# Patient Record
Sex: Female | Born: 1960 | Race: White | Hispanic: No | Smoking: Never smoker
Health system: Southern US, Community
[De-identification: ages and names within clinical notes are randomized; demographics above are authoritative.]

## PROBLEM LIST (undated history)

## (undated) DIAGNOSIS — G43909 Migraine, unspecified, not intractable, without status migrainosus: Secondary | ICD-10-CM

## (undated) DIAGNOSIS — E039 Hypothyroidism, unspecified: Secondary | ICD-10-CM

## (undated) HISTORY — DX: Migraine, unspecified, not intractable, without status migrainosus: G43.909

---

## 1990-03-29 HISTORY — PX: BREAST BIOPSY: SHX20

## 1996-03-29 HISTORY — PX: ENDOMETRIAL BIOPSY: SHX622

## 2003-03-30 HISTORY — PX: OTHER SURGICAL HISTORY: SHX169

## 2004-12-16 ENCOUNTER — Ambulatory Visit: Payer: Self-pay | Admitting: Unknown Physician Specialty

## 2005-01-01 ENCOUNTER — Ambulatory Visit: Payer: Self-pay | Admitting: Unknown Physician Specialty

## 2005-01-25 ENCOUNTER — Ambulatory Visit: Payer: Self-pay | Admitting: Gastroenterology

## 2005-07-05 ENCOUNTER — Ambulatory Visit: Payer: Self-pay | Admitting: Unknown Physician Specialty

## 2006-01-24 ENCOUNTER — Ambulatory Visit: Payer: Self-pay | Admitting: Unknown Physician Specialty

## 2006-10-28 ENCOUNTER — Ambulatory Visit: Payer: Self-pay | Admitting: Unknown Physician Specialty

## 2007-02-01 ENCOUNTER — Ambulatory Visit: Payer: Self-pay | Admitting: Unknown Physician Specialty

## 2008-02-05 ENCOUNTER — Ambulatory Visit: Payer: Self-pay | Admitting: Unknown Physician Specialty

## 2009-09-16 ENCOUNTER — Ambulatory Visit: Payer: Self-pay | Admitting: Unknown Physician Specialty

## 2010-10-15 ENCOUNTER — Ambulatory Visit: Payer: Self-pay | Admitting: Unknown Physician Specialty

## 2011-10-19 ENCOUNTER — Ambulatory Visit: Payer: Self-pay | Admitting: Family Medicine

## 2011-10-21 ENCOUNTER — Ambulatory Visit: Payer: Self-pay | Admitting: Family Medicine

## 2012-11-03 ENCOUNTER — Ambulatory Visit: Payer: Self-pay | Admitting: Family Medicine

## 2013-11-09 ENCOUNTER — Ambulatory Visit: Payer: Self-pay | Admitting: Family Medicine

## 2015-04-21 ENCOUNTER — Ambulatory Visit
Admission: RE | Admit: 2015-04-21 | Discharge: 2015-04-21 | Disposition: A | Discharge status: Home / Self Care | Payer: BC Managed Care – PPO | Source: Ambulatory Visit | Attending: Gastroenterology | Admitting: Gastroenterology

## 2015-04-21 ENCOUNTER — Encounter
Admission: RE | Disposition: A | Discharge status: Home / Self Care | Payer: Self-pay | Source: Ambulatory Visit | Attending: Gastroenterology

## 2015-04-21 ENCOUNTER — Encounter: Payer: Self-pay | Admitting: *Deleted

## 2015-04-21 ENCOUNTER — Ambulatory Visit: Payer: BC Managed Care – PPO | Admitting: Anesthesiology

## 2015-04-21 DIAGNOSIS — E039 Hypothyroidism, unspecified: Secondary | ICD-10-CM | POA: Insufficient documentation

## 2015-04-21 DIAGNOSIS — Z1211 Encounter for screening for malignant neoplasm of colon: Secondary | ICD-10-CM | POA: Insufficient documentation

## 2015-04-21 HISTORY — PX: COLONOSCOPY: SHX5424

## 2015-04-21 HISTORY — DX: Hypothyroidism, unspecified: E03.9

## 2015-04-21 SURGERY — COLONOSCOPY
Anesthesia: General

## 2015-04-21 MED ORDER — LIDOCAINE HCL (PF) 1 % IJ SOLN
2.0000 mL | Freq: Once | INTRAMUSCULAR | Status: AC
  Administered 2015-04-21: 0.03 mL via INTRADERMAL

## 2015-04-21 MED ORDER — SODIUM CHLORIDE 0.9 % IV SOLN: INTRAVENOUS | Status: DC

## 2015-04-21 MED ORDER — PROPOFOL 10 MG/ML IV BOLUS
INTRAVENOUS | Status: DC | PRN
  Administered 2015-04-21: 70 mg via INTRAVENOUS
  Administered 2015-04-21 (×2): 40 mg via INTRAVENOUS
  Administered 2015-04-21: 30 mg via INTRAVENOUS

## 2015-04-21 MED ORDER — LIDOCAINE HCL (CARDIAC) 20 MG/ML IV SOLN
INTRAVENOUS | Status: DC | PRN
  Administered 2015-04-21: 20 mg via INTRAVENOUS

## 2015-04-21 MED ORDER — LIDOCAINE HCL (PF) 1 % IJ SOLN
INTRAMUSCULAR | Status: AC
  Administered 2015-04-21: 0.03 mL via INTRADERMAL
  Filled 2015-04-21: qty 2

## 2015-04-21 MED ORDER — PROPOFOL 500 MG/50ML IV EMUL
INTRAVENOUS | Status: DC | PRN
  Administered 2015-04-21: 150 ug/kg/min via INTRAVENOUS

## 2015-04-21 MED ORDER — SODIUM CHLORIDE 0.9 % IV SOLN
INTRAVENOUS | Status: DC
  Administered 2015-04-21: 1000 mL via INTRAVENOUS

## 2015-04-21 NOTE — Op Note (Signed)
[facility] Patient Name: Latasha Taylor Procedure Date: 04/21/2015 2:07 PM MRN: [id number] Account #: [id number] Date of Birth: 1961/01/27 Admit Type: Outpatient Age: 55 Room: [facility] ENDO ROOM 4 Gender: Female Note Status: Finalized Procedure:         Colonoscopy Indications:       Screening for colorectal malignant neoplasm Providers:         Ezzard Standing. Bluford Kaufmann, MD Referring MD:      Fernand Parkins. Clint Guy (Referring MD) Medicines:         Monitored Anesthesia Care Complications:     No immediate complications. Procedure:         Pre-Anesthesia Assessment:                    - Prior to the procedure, a History and Physical was                     performed, and patient medications, allergies and                     sensitivities were reviewed. The patient's tolerance of                     previous anesthesia was reviewed.                    - The risks and benefits of the procedure and the sedation                     options and risks were discussed with the patient. All                     questions were answered and informed consent was obtained.                    - After reviewing the risks and benefits, the patient was                     deemed in satisfactory condition to undergo the procedure.                    After obtaining informed consent, the colonoscope was                     passed under direct vision. Throughout the procedure, the                     patient's blood pressure, pulse, and oxygen saturations                     were monitored continuously. The Olympus CF-Q160AL                     colonoscope (S#. [id number]) was introduced through the anus                     and advanced to the the cecum, identified by appendiceal                     orifice and ileocecal valve. The colonoscopy was performed                     without difficulty. The patient tolerated the procedure  well. The quality of the bowel preparation  was good. Findings:      The colon (entire examined portion) appeared normal. Impression:        - The entire examined colon is normal.                    - No specimens collected. Recommendation:    - Discharge patient to home.                    - Repeat colonoscopy in 10 years for surveillance.                    - Return to GI office. Procedure Code(s): --- Professional ---                    [phone], Colonoscopy, flexible; diagnostic, including                     collection of specimen(s) by brushing or washing, when                     performed (separate procedure) Diagnosis Code(s): --- Professional ---                    Z12.11, Encounter for screening for malignant neoplasm of                     colon CPT copyright 2014 American Medical Association. All rights reserved. The codes documented in this report are preliminary and upon coder review may  be revised to meet current compliance requirements. Wallace Cullens, MD 04/21/2015 2:24:26 PM This report has been signed electronically. Number of Addenda: 0 Note Initiated On: 04/21/2015 2:07 PM Scope Withdrawal Time: 0 hours 6 minutes 8 seconds  Total Procedure Duration: 0 hours 9 minutes 57 seconds       [facility]

## 2015-04-21 NOTE — H&P (Signed)
    Primary Care Physician:  Domenic Schwab, FNP Primary Gastroenterologist:  Dr. Bluford Kaufmann  Pre-Procedure History & Physical: HPI:  Latasha Taylor is a 55 y.o. female is here for an colonoscopy.   No past medical history on file.  No past surgical history on file.  Prior to Admission medications   Not on File    Allergies as of 03/20/2015  . (Not on File)    No family history on file.  Social History   Social History  . Marital Status: Married    Spouse Name: N/A  . Number of Children: N/A  . Years of Education: N/A   Occupational History  . Not on file.   Social History Main Topics  . Smoking status: Not on file  . Smokeless tobacco: Not on file  . Alcohol Use: Not on file  . Drug Use: Not on file  . Sexual Activity: Not on file   Other Topics Concern  . Not on file   Social History Narrative  . No narrative on file    Review of Systems: See HPI, otherwise negative ROS  Physical Exam: There were no vitals taken for this visit. General:   Alert,  pleasant and cooperative in NAD Head:  Normocephalic and atraumatic. Neck:  Supple; no masses or thyromegaly. Lungs:  Clear throughout to auscultation.    Heart:  Regular rate and rhythm. Abdomen:  Soft, nontender and nondistended. Normal bowel sounds, without guarding, and without rebound.   Neurologic:  Alert and  oriented x4;  grossly normal neurologically.  Impression/Plan: Corrie Dandy is here for an colonoscopy to be performed for screening Risks, benefits, limitations, and alternatives regarding colonscopy have been reviewed with the patient.  Questions have been answered.  All parties agreeable.   Seth Higginbotham, Ezzard Standing, MD  04/21/2015, 1:21 PM

## 2015-04-21 NOTE — Anesthesia Preprocedure Evaluation (Signed)
Anesthesia Evaluation  Patient identified by MRN, date of birth, ID band Patient awake    Reviewed: Allergy & Precautions, H&P , NPO status , Patient's Chart, lab work & pertinent test results, reviewed documented beta blocker date and time   History of Anesthesia Complications Negative for: history of anesthetic complications  Airway Mallampati: III  TM Distance: >3 FB Neck ROM: full    Dental no notable dental hx. (+) Caps   Pulmonary neg pulmonary ROS,    Pulmonary exam normal breath sounds clear to auscultation       Cardiovascular Exercise Tolerance: Good negative cardio ROS Normal cardiovascular exam Rhythm:regular Rate:Normal     Neuro/Psych negative neurological ROS  negative psych ROS   GI/Hepatic negative GI ROS, Neg liver ROS,   Endo/Other  neg diabetesHypothyroidism   Renal/GU negative Renal ROS  negative genitourinary   Musculoskeletal   Abdominal   Peds  Hematology negative hematology ROS (+)   Anesthesia Other Findings Past Medical History:   Hypothyroidism                                               Reproductive/Obstetrics negative OB ROS                             Anesthesia Physical Anesthesia Plan  ASA: II  Anesthesia Plan: General   Post-op Pain Management:    Induction:   Airway Management Planned:   Additional Equipment:   Intra-op Plan:   Post-operative Plan:   Informed Consent: I have reviewed the patients History and Physical, chart, labs and discussed the procedure including the risks, benefits and alternatives for the proposed anesthesia with the patient or authorized representative who has indicated his/her understanding and acceptance.   Dental Advisory Given  Plan Discussed with: Anesthesiologist, CRNA and Surgeon  Anesthesia Plan Comments:         Anesthesia Quick Evaluation

## 2015-04-21 NOTE — Transfer of Care (Signed)
Immediate Anesthesia Transfer of Care Note  Patient: Latasha Taylor  Procedure(s) Performed: Procedure(s): COLONOSCOPY (N/A)  Patient Location: Endoscopy Unit  Anesthesia Type:General  Level of Consciousness: sedated  Airway & Oxygen Therapy: Patient Spontanous Breathing and Patient connected to nasal cannula oxygen  Post-op Assessment: Report given to RN and Post -op Vital signs reviewed and stable  Post vital signs: Reviewed and stable  Last Vitals:  Filed Vitals:   04/21/15 1342 04/21/15 1426  BP: 131/74 90/57  Pulse: 64 71  Temp: 35.9 C 36 C  Resp: 16 16    Complications: No apparent anesthesia complications

## 2015-04-22 NOTE — Anesthesia Postprocedure Evaluation (Signed)
Anesthesia Post Note  Patient: Latasha Taylor  Procedure(s) Performed: Procedure(s) (LRB): COLONOSCOPY (N/A)  Patient location during evaluation: Endoscopy Anesthesia Type: General Level of consciousness: awake and alert Pain management: pain level controlled Vital Signs Assessment: post-procedure vital signs reviewed and stable Respiratory status: spontaneous breathing, nonlabored ventilation, respiratory function stable and patient connected to nasal cannula oxygen Cardiovascular status: blood pressure returned to baseline and stable Postop Assessment: no signs of nausea or vomiting Anesthetic complications: no    Last Vitals:  Filed Vitals:   04/21/15 1446 04/21/15 1456  BP: 105/74 121/76  Pulse: 63 84  Temp:    Resp: 18 12    Last Pain: There were no vitals filed for this visit.               Lenard Simmer

## 2015-04-23 ENCOUNTER — Encounter: Payer: Self-pay | Admitting: Gastroenterology

## 2015-07-08 ENCOUNTER — Other Ambulatory Visit: Payer: Self-pay | Admitting: Obstetrics & Gynecology

## 2015-07-08 DIAGNOSIS — Z1239 Encounter for other screening for malignant neoplasm of breast: Secondary | ICD-10-CM

## 2015-07-16 ENCOUNTER — Ambulatory Visit
Admission: RE | Admit: 2015-07-16 | Discharge: 2015-07-16 | Disposition: A | Discharge status: Home / Self Care | Payer: BC Managed Care – PPO | Source: Ambulatory Visit | Attending: Obstetrics & Gynecology | Admitting: Obstetrics & Gynecology

## 2015-07-16 DIAGNOSIS — Z1231 Encounter for screening mammogram for malignant neoplasm of breast: Secondary | ICD-10-CM | POA: Insufficient documentation

## 2015-07-16 DIAGNOSIS — Z1239 Encounter for other screening for malignant neoplasm of breast: Secondary | ICD-10-CM

## 2016-09-27 ENCOUNTER — Ambulatory Visit (INDEPENDENT_AMBULATORY_CARE_PROVIDER_SITE_OTHER): Payer: BC Managed Care – PPO | Admitting: Advanced Practice Midwife

## 2016-09-27 ENCOUNTER — Other Ambulatory Visit: Payer: Self-pay | Admitting: Advanced Practice Midwife

## 2016-09-27 ENCOUNTER — Encounter: Payer: Self-pay | Admitting: Advanced Practice Midwife

## 2016-09-27 ENCOUNTER — Other Ambulatory Visit: Payer: Self-pay | Admitting: Unknown Physician Specialty

## 2016-09-27 VITALS — BP 114/70 | Ht 63.0 in | Wt 114.0 lb

## 2016-09-27 DIAGNOSIS — Z01419 Encounter for gynecological examination (general) (routine) without abnormal findings: Secondary | ICD-10-CM

## 2016-09-27 DIAGNOSIS — Z1231 Encounter for screening mammogram for malignant neoplasm of breast: Secondary | ICD-10-CM

## 2016-09-27 NOTE — Progress Notes (Signed)
Patient ID: Latasha Taylor, female   DOB: 08-03-1960, 56 y.o.   MRN: [id number]     Gynecology Annual Exam  PCP: Armando Gang, FNP  Chief Complaint:  Chief Complaint  Patient presents with  . Annual Exam    History of Present Illness:Patient is a 56 y.o. G2P2002 presents for annual exam. The patient has no complaints today.   LMP: No LMP recorded. Patient is postmenopausal.    The patient is sexually active. She denies dyspareunia.  The patient does perform self breast exams.  There is no notable family history of breast or ovarian cancer in her family.  The patient wears seatbelts: yes.   The patient has regular exercise: yes.  She admits healthy diet.   The patient denies current symptoms of depression.     Review of Systems: Review of Systems  Constitutional: Negative.   HENT: Negative.   Eyes: Negative.   Respiratory: Negative.   Cardiovascular: Negative.   Gastrointestinal: Negative.   Genitourinary: Negative.   Musculoskeletal: Negative.   Skin: Negative.   Neurological: Negative.   Endo/Heme/Allergies: Negative.   Psychiatric/Behavioral: Negative.     Past Medical History:  Past Medical History:  Diagnosis Date  . Hypothyroidism     Past Surgical History:  Past Surgical History:  Procedure Laterality Date  . COLONOSCOPY N/A 04/21/2015   Procedure: COLONOSCOPY;  Surgeon: Wallace Cullens, MD;  Location: [facility] ENDOSCOPY;  Service: Gastroenterology;  Laterality: N/A;    Gynecologic History:  No LMP recorded. Patient is postmenopausal. Last Pap: Results were: normal in 2015   Last mammogram: 1 year ago normal  Obstetric History: [id number]  Family History:  History reviewed. No pertinent family history.  Social History:  Social History   Social History  . Marital status: Married    Spouse name: N/A  . Number of children: N/A  . Years of education: N/A   Occupational History  . Not on file.   Social History Main Topics  . Smoking status: Never  Smoker  . Smokeless tobacco: Never Used  . Alcohol use No  . Drug use: No  . Sexual activity: Not Currently    Birth control/ protection: Post-menopausal   Other Topics Concern  . Not on file   Social History Narrative  . No narrative on file    Allergies:  No Known Allergies  Medications: Prior to Admission medications   Medication Sig Start Date End Date Taking? Authorizing Provider  levothyroxine (SYNTHROID, LEVOTHROID) 75 MCG tablet Take 75 mcg by mouth daily before breakfast.   Yes [provider]    Physical Exam Vitals: Blood pressure 114/70, height 5\' 3"  (1.6 m), weight 114 lb (51.7 kg).  General: NAD HEENT: normocephalic, anicteric Thyroid: no enlargement, no palpable nodules Pulmonary: No increased work of breathing, CTAB Cardiovascular: RRR, distal pulses 2+ Breast: Breast symmetrical, no tenderness, no palpable nodules or masses, no skin or nipple retraction present, no nipple discharge.  No axillary or supraclavicular lymphadenopathy. Abdomen: NABS, soft, non-tender, non-distended.  Umbilicus without lesions.  No hepatomegaly, splenomegaly or masses palpable. No evidence of hernia  Genitourinary: deferred for no concerns and no PAP this year  Extremities: no edema, erythema, or tenderness Neurologic: Grossly intact Psychiatric: mood appropriate, affect full   Assessment: 56 y.o. [id number] Well woman exam  Plan: Problem List Items Addressed This Visit    None    Visit Diagnoses    Well woman exam with routine gynecological exam    -  Primary  1) Mammogram - recommend yearly screening mammogram.  Mammogram patient to self schedule  2) Continue healthy lifestyle diet and exercise  3) ASCCP guidelines and rational discussed.  Patient opts for every 3-5 years screening interval  4) Osteoporosis  - per USPTF routine screening DEXA at age 56  5) Routine healthcare maintenance including cholesterol, diabetes screening discussed  Declines  6) Colonoscopy up to date.  Screening recommended starting at age 56 for average risk individuals, age 56 for individuals deemed at increased risk (including African Americans) and recommended to continue until age 56.  For patient age 56-85 individualized approach is recommended.  Gold standard screening is via colonoscopy, Cologuard screening is an acceptable alternative for patient unwilling or unable to undergo colonoscopy.  "Colorectal cancer screening for average?risk adults: 2018 guideline update from the American Cancer Society"[place]: A Cancer Journal for Clinicians: Aug 25, 2016   7) Follow up 1 year for routine annual   Latasha Taylor, [place]

## 2016-10-13 ENCOUNTER — Ambulatory Visit
Admission: RE | Admit: 2016-10-13 | Discharge: 2016-10-13 | Disposition: A | Discharge status: Home / Self Care | Payer: BC Managed Care – PPO | Source: Ambulatory Visit | Attending: Advanced Practice Midwife | Admitting: Advanced Practice Midwife

## 2016-10-13 ENCOUNTER — Telehealth: Payer: Self-pay

## 2016-10-13 DIAGNOSIS — Z1231 Encounter for screening mammogram for malignant neoplasm of breast: Secondary | ICD-10-CM | POA: Diagnosis present

## 2016-10-13 NOTE — Telephone Encounter (Signed)
Pt was seen 09/27/16 for annual exam.  JEG gave her a form to turn into her ins.  Ins has messed things up and pt needs another copy of that form to resubmit .  [phone]  Pt requests it be mailed.  Done.

## 2017-09-30 ENCOUNTER — Encounter: Payer: Self-pay | Admitting: Advanced Practice Midwife

## 2017-09-30 ENCOUNTER — Ambulatory Visit (INDEPENDENT_AMBULATORY_CARE_PROVIDER_SITE_OTHER): Payer: BC Managed Care – PPO | Admitting: Advanced Practice Midwife

## 2017-09-30 ENCOUNTER — Other Ambulatory Visit (HOSPITAL_COMMUNITY)
Admission: RE | Admit: 2017-09-30 | Discharge: 2017-09-30 | Disposition: A | Discharge status: Home / Self Care | Payer: BC Managed Care – PPO | Source: Ambulatory Visit | Attending: Advanced Practice Midwife | Admitting: Advanced Practice Midwife

## 2017-09-30 VITALS — BP 110/66 | HR 74 | Ht 63.0 in | Wt 119.0 lb

## 2017-09-30 DIAGNOSIS — Z01419 Encounter for gynecological examination (general) (routine) without abnormal findings: Secondary | ICD-10-CM

## 2017-09-30 DIAGNOSIS — Z1239 Encounter for other screening for malignant neoplasm of breast: Secondary | ICD-10-CM

## 2017-09-30 DIAGNOSIS — Z124 Encounter for screening for malignant neoplasm of cervix: Secondary | ICD-10-CM

## 2017-09-30 DIAGNOSIS — Z1231 Encounter for screening mammogram for malignant neoplasm of breast: Secondary | ICD-10-CM | POA: Diagnosis not present

## 2017-09-30 LAB — HM PAP SMEAR: HM Pap smear: NORMAL

## 2017-09-30 NOTE — Patient Instructions (Signed)
Mediterranean Diet A Mediterranean diet refers to food and lifestyle choices that are based on the traditions of countries located on the [place]. This way of eating has been shown to help prevent certain conditions and improve outcomes for people who have chronic diseases, like kidney disease and heart disease. What are tips for following this plan? Lifestyle  Cook and eat meals together with your family, when possible.  Drink enough fluid to keep your urine clear or pale yellow.  Be physically active every day. This includes: ? Aerobic exercise like running or swimming. ? Leisure activities like gardening, walking, or housework.  Get 7-8 hours of sleep each night.  If recommended by your health care provider, drink red wine in moderation. This means 1 glass a day for nonpregnant women and 2 glasses a day for men. A glass of wine equals 5 oz (150 mL). Reading food labels  Check the serving size of packaged foods. For foods such as rice and pasta, the serving size refers to the amount of cooked product, not dry.  Check the total fat in packaged foods. Avoid foods that have saturated fat or trans fats.  Check the ingredients list for added sugars, such as corn syrup. Shopping  At the grocery store, buy most of your food from the areas near the walls of the store. This includes: ? Fresh fruits and vegetables (produce). ? Grains, beans, nuts, and seeds. Some of these may be available in unpackaged forms or large amounts (in bulk). ? Fresh seafood. ? Poultry and eggs. ? Low-fat dairy products.  Buy whole ingredients instead of prepackaged foods.  Buy fresh fruits and vegetables in-season from local farmers markets.  Buy frozen fruits and vegetables in resealable bags.  If you do not have access to quality fresh seafood, buy precooked frozen shrimp or canned fish, such as tuna, salmon, or sardines.  Buy small amounts of raw or cooked vegetables, salads, or olives from the  deli or salad bar at your store.  Stock your pantry so you always have certain foods on hand, such as olive oil, canned tuna, canned tomatoes, rice, pasta, and beans. Cooking  Cook foods with extra-virgin olive oil instead of using butter or other vegetable oils.  Have meat as a side dish, and have vegetables or grains as your main dish. This means having meat in small portions or adding small amounts of meat to foods like pasta or stew.  Use beans or vegetables instead of meat in common dishes like chili or lasagna.  Experiment with different cooking methods. Try roasting or broiling vegetables instead of steaming or sauteing them.  Add frozen vegetables to soups, stews, pasta, or rice.  Add nuts or seeds for added healthy fat at each meal. You can add these to yogurt, salads, or vegetable dishes.  Marinate fish or vegetables using olive oil, lemon juice, garlic, and fresh herbs. Meal planning  Plan to eat 1 vegetarian meal one day each week. Try to work up to 2 vegetarian meals, if possible.  Eat seafood 2 or more times a week.  Have healthy snacks readily available, such as: ? Vegetable sticks with hummus. ? Greek yogurt. ? Fruit and nut trail mix.  Eat balanced meals throughout the week. This includes: ? Fruit: 2-3 servings a day ? Vegetables: 4-5 servings a day ? Low-fat dairy: 2 servings a day ? Fish, poultry, or lean meat: 1 serving a day ? Beans and legumes: 2 or more servings a week ? Nuts   and seeds: 1-2 servings a day ? Whole grains: 6-8 servings a day ? Extra-virgin olive oil: 3-4 servings a day  Limit red meat and sweets to only a few servings a month What are my food choices?  Mediterranean diet ? Recommended ? Grains: Whole-grain pasta. Brown rice. Bulgar wheat. Polenta. Couscous. Whole-wheat bread. Oatmeal. Quinoa. ? Vegetables: Artichokes. Beets. Broccoli. Cabbage. Carrots. Eggplant. Green beans. Chard. Kale. Spinach. Onions. Leeks. Peas. Squash.  Tomatoes. Peppers. Radishes. ? Fruits: Apples. Apricots. Avocado. Berries. Bananas. Cherries. Dates. Figs. Grapes. Lemons. Melon. Oranges. Peaches. Plums. Pomegranate. ? Meats and other protein foods: Beans. Almonds. Sunflower seeds. Pine nuts. Peanuts. Cod. Salmon. Scallops. Shrimp. Tuna. Tilapia. Clams. Oysters. Eggs. ? Dairy: Low-fat milk. Cheese. Greek yogurt. ? Beverages: Water. Red wine. Herbal tea. ? Fats and oils: Extra virgin olive oil. Avocado oil. Grape seed oil. ? Sweets and desserts: Greek yogurt with honey. Baked apples. Poached pears. Trail mix. ? Seasoning and other foods: Basil. Cilantro. Coriander. Cumin. Mint. Parsley. Sage. Rosemary. Tarragon. Garlic. Oregano. Thyme. Pepper. Balsalmic vinegar. Tahini. Hummus. Tomato sauce. Olives. Mushrooms. ? Limit these ? Grains: Prepackaged pasta or rice dishes. Prepackaged cereal with added sugar. ? Vegetables: Deep fried potatoes (french fries). ? Fruits: Fruit canned in syrup. ? Meats and other protein foods: Beef. Pork. Lamb. Poultry with skin. Hot dogs. Bacon. ? Dairy: Ice cream. Sour cream. Whole milk. ? Beverages: Juice. Sugar-sweetened soft drinks. Beer. Liquor and spirits. ? Fats and oils: Butter. Canola oil. Vegetable oil. Beef fat (tallow). Lard. ? Sweets and desserts: Cookies. Cakes. Pies. Candy. ? Seasoning and other foods: Mayonnaise. Premade sauces and marinades. ? The items listed may not be a complete list. Talk with your dietitian about what dietary choices are right for you. Summary  The Mediterranean diet includes both food and lifestyle choices.  Eat a variety of fresh fruits and vegetables, beans, nuts, seeds, and whole grains.  Limit the amount of red meat and sweets that you eat.  Talk with your health care provider about whether it is safe for you to drink red wine in moderation. This means 1 glass a day for nonpregnant women and 2 glasses a day for men. A glass of wine equals 5 oz (150 mL). This information  is not intended to replace advice given to you by your health care provider. Make sure you discuss any questions you have with your health care provider. Document Released: 11/06/2015 Document Revised: 12/09/2015 Document Reviewed: 11/06/2015 Elsevier Interactive Patient Education  2018 [employer] American Heart Association (AHA) Exercise Recommendation  Being physically active is important to prevent heart disease and stroke, the nation's No. 1and No. 5killers. To improve overall cardiovascular health, we suggest at least 150 minutes per week of moderate exercise or 75 minutes per week of vigorous exercise (or a combination of moderate and vigorous activity). Thirty minutes a day, five times a week is an easy goal to remember. You will also experience benefits even if you divide your time into two or three segments of 10 to 15 minutes per day.  For people who would benefit from lowering their blood pressure or cholesterol, we recommend 40 minutes of aerobic exercise of moderate to vigorous intensity three to four times a week to lower the risk for heart attack and stroke.  Physical activity is anything that makes you move your body and burn calories.  This includes things like climbing stairs or playing sports. Aerobic exercises benefit your heart, and include walking, jogging, swimming or biking. Strength and   stretching exercises are best for overall stamina and flexibility.  The simplest, positive change you can make to effectively improve your heart health is to start walking. It's enjoyable, free, easy, social and great exercise. A walking program is flexible and boasts high success rates because people can stick with it. It's easy for walking to become a regular and satisfying part of life.   For Overall Cardiovascular Health:  At least 30 minutes of moderate-intensity aerobic activity at least 5 days per week for a total of 150  OR   At least 25 minutes of vigorous aerobic activity  at least 3 days per week for a total of 75 minutes; or a combination of moderate- and vigorous-intensity aerobic activity  AND   Moderate- to high-intensity muscle-strengthening activity at least 2 days per week for additional health benefits.  For Lowering Blood Pressure and Cholesterol  An average 40 minutes of moderate- to vigorous-intensity aerobic activity 3 or 4 times per week  What if I can't make it to the time goal? Something is always better than nothing! And everyone has to start somewhere. Even if you've been sedentary for years, today is the day you can begin to make healthy changes in your life. If you don't think you'll make it for 30 or 40 minutes, set a reachable goal for today. You can work up toward your overall goal by increasing your time as you get stronger. Don't let all-or-nothing thinking rob you of doing what you can every day.  Source:http://www.heart.org   Health Maintenance, Female Adopting a healthy lifestyle and getting preventive care can go a long way to promote health and wellness. Talk with your health care provider about what schedule of regular examinations is right for you. This is a good chance for you to check in with your provider about disease prevention and staying healthy. In between checkups, there are plenty of things you can do on your own. Experts have done a lot of research about which lifestyle changes and preventive measures are most likely to keep you healthy. Ask your health care provider for more information. Weight and diet Eat a healthy diet  Be sure to include plenty of vegetables, fruits, low-fat dairy products, and lean protein.  Do not eat a lot of foods high in solid fats, added sugars, or salt.  Get regular exercise. This is one of the most important things you can do for your health. ? Most adults should exercise for at least 150 minutes each week. The exercise should increase your heart rate and make you sweat  (moderate-intensity exercise). ? Most adults should also do strengthening exercises at least twice a week. This is in addition to the moderate-intensity exercise.  Maintain a healthy weight  Body mass index (BMI) is a measurement that can be used to identify possible weight problems. It estimates body fat based on height and weight. Your health care provider can help determine your BMI and help you achieve or maintain a healthy weight.  For females 20 years of age and older: ? A BMI below 18.5 is considered underweight. ? A BMI of 18.5 to 24.9 is normal. ? A BMI of 25 to 29.9 is considered overweight. ? A BMI of 30 and above is considered obese.  Watch levels of cholesterol and blood lipids  You should start having your blood tested for lipids and cholesterol at 57 years of age, then have this test every 5 years.  You may need to have your cholesterol levels   checked more often if: ? Your lipid or cholesterol levels are high. ? You are older than 57 years of age. ? You are at high risk for heart disease.  Cancer screening Lung Cancer  Lung cancer screening is recommended for adults 64-5 years old who are at high risk for lung cancer because of a history of smoking.  A yearly low-dose CT scan of the lungs is recommended for people who: ? Currently smoke. ? Have quit within the past 15 years. ? Have at least a 30-pack-year history of smoking. A pack year is smoking an average of one pack of cigarettes a day for 1 year.  Yearly screening should continue until it has been 15 years since you quit.  Yearly screening should stop if you develop a health problem that would prevent you from having lung cancer treatment.  Breast Cancer  Practice breast self-awareness. This means understanding how your breasts normally appear and feel.  It also means doing regular breast self-exams. Let your health care provider know about any changes, no matter how small.  If you are in your 20s or  30s, you should have a clinical breast exam (CBE) by a health care provider every 1-3 years as part of a regular health exam.  If you are 56 or older, have a CBE every year. Also consider having a breast X-ray (mammogram) every year.  If you have a family history of breast cancer, talk to your health care provider about genetic screening.  If you are at high risk for breast cancer, talk to your health care provider about having an MRI and a mammogram every year.  Breast cancer gene (BRCA) assessment is recommended for women who have family members with BRCA-related cancers. BRCA-related cancers include: ? Breast. ? Ovarian. ? Tubal. ? Peritoneal cancers.  Results of the assessment will determine the need for genetic counseling and BRCA1 and BRCA2 testing.  Cervical Cancer Your health care provider may recommend that you be screened regularly for cancer of the pelvic organs (ovaries, uterus, and vagina). This screening involves a pelvic examination, including checking for microscopic changes to the surface of your cervix (Pap test). You may be encouraged to have this screening done every 3 years, beginning at age 29.  For women ages 3-65, health care providers may recommend pelvic exams and Pap testing every 3 years, or they may recommend the Pap and pelvic exam, combined with testing for human papilloma virus (HPV), every 5 years. Some types of HPV increase your risk of cervical cancer. Testing for HPV may also be done on women of any age with unclear Pap test results.  Other health care providers may not recommend any screening for nonpregnant women who are considered low risk for pelvic cancer and who do not have symptoms. Ask your health care provider if a screening pelvic exam is right for you.  If you have had past treatment for cervical cancer or a condition that could lead to cancer, you need Pap tests and screening for cancer for at least 20 years after your treatment. If Pap tests  have been discontinued, your risk factors (such as having a new sexual partner) need to be reassessed to determine if screening should resume. Some women have medical problems that increase the chance of getting cervical cancer. In these cases, your health care provider may recommend more frequent screening and Pap tests.  Colorectal Cancer  This type of cancer can be detected and often prevented.  Routine colorectal cancer screening  usually begins at 57 years of age and continues through 57 years of age.  Your health care provider may recommend screening at an earlier age if you have risk factors for colon cancer.  Your health care provider may also recommend using home test kits to check for hidden blood in the stool.  A small camera at the end of a tube can be used to examine your colon directly (sigmoidoscopy or colonoscopy). This is done to check for the earliest forms of colorectal cancer.  Routine screening usually begins at age 8.  Direct examination of the colon should be repeated every 5-10 years through 57 years of age. However, you may need to be screened more often if early forms of precancerous polyps or small growths are found.  Skin Cancer  Check your skin from head to toe regularly.  Tell your health care provider about any new moles or changes in moles, especially if there is a change in a mole's shape or color.  Also tell your health care provider if you have a mole that is larger than the size of a pencil eraser.  Always use sunscreen. Apply sunscreen liberally and repeatedly throughout the day.  Protect yourself by wearing long sleeves, pants, a wide-brimmed hat, and sunglasses whenever you are outside.  Heart disease, diabetes, and high blood pressure  High blood pressure causes heart disease and increases the risk of stroke. High blood pressure is more likely to develop in: ? People who have blood pressure in the high end of the normal range (130-139/85-89 mm  Hg). ? People who are overweight or obese. ? People who are African American.  If you are 29-23 years of age, have your blood pressure checked every 3-5 years. If you are 68 years of age or older, have your blood pressure checked every year. You should have your blood pressure measured twice-once when you are at a hospital or clinic, and once when you are not at a hospital or clinic. Record the average of the two measurements. To check your blood pressure when you are not at a hospital or clinic, you can use: ? An automated blood pressure machine at a pharmacy. ? A home blood pressure monitor.  If you are between 82 years and 71 years old, ask your health care provider if you should take aspirin to prevent strokes.  Have regular diabetes screenings. This involves taking a blood sample to check your fasting blood sugar level. ? If you are at a normal weight and have a low risk for diabetes, have this test once every three years after 57 years of age. ? If you are overweight and have a high risk for diabetes, consider being tested at a younger age or more often. Preventing infection Hepatitis B  If you have a higher risk for hepatitis B, you should be screened for this virus. You are considered at high risk for hepatitis B if: ? You were born in a country where hepatitis B is common. Ask your health care provider which countries are considered high risk. ? Your parents were born in a high-risk country, and you have not been immunized against hepatitis B (hepatitis B vaccine). ? You have HIV or AIDS. ? You use needles to inject street drugs. ? You live with someone who has hepatitis B. ? You have had sex with someone who has hepatitis B. ? You get hemodialysis treatment. ? You take certain medicines for conditions, including cancer, organ transplantation, and autoimmune conditions.  Hepatitis C  Blood testing is recommended for: ? Everyone born from [birth year] through 1965. ? Anyone with known  risk factors for hepatitis C.  Sexually transmitted infections (STIs)  You should be screened for sexually transmitted infections (STIs) including gonorrhea and chlamydia if: ? You are sexually active and are younger than 57 years of age. ? You are older than 57 years of age and your health care provider tells you that you are at risk for this type of infection. ? Your sexual activity has changed since you were last screened and you are at an increased risk for chlamydia or gonorrhea. Ask your health care provider if you are at risk.  If you do not have HIV, but are at risk, it may be recommended that you take a prescription medicine daily to prevent HIV infection. This is called pre-exposure prophylaxis (PrEP). You are considered at risk if: ? You are sexually active and do not regularly use condoms or know the HIV status of your partner(s). ? You take drugs by injection. ? You are sexually active with a partner who has HIV.  Talk with your health care provider about whether you are at high risk of being infected with HIV. If you choose to begin PrEP, you should first be tested for HIV. You should then be tested every 3 months for as long as you are taking PrEP. Pregnancy  If you are premenopausal and you may become pregnant, ask your health care provider about preconception counseling.  If you may become pregnant, take 400 to 800 micrograms (mcg) of folic acid every day.  If you want to prevent pregnancy, talk to your health care provider about birth control (contraception). Osteoporosis and menopause  Osteoporosis is a disease in which the bones lose minerals and strength with aging. This can result in serious bone fractures. Your risk for osteoporosis can be identified using a bone density scan.  If you are 74 years of age or older, or if you are at risk for osteoporosis and fractures, ask your health care provider if you should be screened.  Ask your health care provider whether you  should take a calcium or vitamin D supplement to lower your risk for osteoporosis.  Menopause may have certain physical symptoms and risks.  Hormone replacement therapy may reduce some of these symptoms and risks. Talk to your health care provider about whether hormone replacement therapy is right for you. Follow these instructions at home:  Schedule regular health, dental, and eye exams.  Stay current with your immunizations.  Do not use any tobacco products including cigarettes, chewing tobacco, or electronic cigarettes.  If you are pregnant, do not drink alcohol.  If you are breastfeeding, limit how much and how often you drink alcohol.  Limit alcohol intake to no more than 1 drink per day for nonpregnant women. One drink equals 12 ounces of beer, 5 ounces of wine, or 1 ounces of hard liquor.  Do not use street drugs.  Do not share needles.  Ask your health care provider for help if you need support or information about quitting drugs.  Tell your health care provider if you often feel depressed.  Tell your health care provider if you have ever been abused or do not feel safe at home. This information is not intended to replace advice given to you by your health care provider. Make sure you discuss any questions you have with your health care provider. Document Released: 09/28/2010 Document Revised: 08/21/2015 Document Reviewed:  12/17/2014 Elsevier Interactive Patient Education  [employer].

## 2017-09-30 NOTE — Progress Notes (Signed)
Patient ID: Latasha Taylor, female   DOB: [birth year], 57 y.o.   MRN: [id number]      Gynecology Annual Exam  PCP: Armando GangLindley, Cheryl P, FNP  Chief Complaint:  Chief Complaint  Patient presents with  . Gynecologic Exam    History of Present Illness:Patient is a 57 y.o. G2P2002 presents for annual exam. The patient has no complaints today.   LMP: No LMP recorded. Patient is postmenopausal. Menarche:not applicable   Postcoital Bleeding: no Dysmenorrhea: not applicable  The patient is not currently sexually active. She denies dyspareunia.  The patient does perform self breast exams.  There is no notable family history of breast or ovarian cancer in her family.  The patient wears seatbelts: yes.   The patient has regular exercise: yes.    The patient denies current symptoms of depression.     Review of Systems: Review of Systems  Constitutional: Negative.   HENT: Negative.   Eyes: Negative.   Respiratory: Negative.   Cardiovascular: Negative.   Gastrointestinal: Negative.   Genitourinary: Negative.   Musculoskeletal: Negative.   Skin: Negative.   Neurological: Negative.   Endo/Heme/Allergies: Negative.   Psychiatric/Behavioral: Negative.     Past Medical History:  Past Medical History:  Diagnosis Date  . Hypothyroidism     Past Surgical History:  Past Surgical History:  Procedure Laterality Date  . BREAST BIOPSY  1992   Left  . COLONOSCOPY N/A 04/21/2015   Procedure: COLONOSCOPY;  Surgeon: Wallace CullensPaul Y Oh, MD;  [facility];  Service: Gastroenterology;  Laterality: N/A;  . endocervical polyp removal  2005  . ENDOMETRIAL BIOPSY  1998    Gynecologic History:  No LMP recorded. Patient is postmenopausal. Last Pap: 4 years ago Results were:  no abnormalities  Last mammogram: 1 year ago Results were: BI-RAD I  Obstetric History: [id number]: G2P2002  Family History:  Family History  Problem Relation Age of Onset  . Celiac disease Mother   . Heart disease Father   .  Colon cancer Maternal Grandfather [id number]  . Breast cancer Neg Hx     Social History:  Social History   Socioeconomic History  . Marital status: Married    Spouse name: Not on file  . Number of children: Not on file  . Years of education: Not on file  . Highest education level: Not on file  Occupational History  . Not on file  Social Needs  . Financial resource strain: Not on file  . Food insecurity:    Worry: Not on file    Inability: Not on file  . Transportation needs:    Medical: Not on file    Non-medical: Not on file  Tobacco Use  . Smoking status: Never Smoker  . Smokeless tobacco: Never Used  Substance and Sexual Activity  . Alcohol use: No  . Drug use: No  . Sexual activity: Not Currently    Birth control/protection: Post-menopausal  Lifestyle  . Physical activity:    Days per week: 3 days    Minutes per session: 30 min  . Stress: Not on file  Relationships  . Social connections:    Talks on phone: Not on file    Gets together: Not on file    Attends religious service: Not on file    Active member of club or organization: Not on file    Attends meetings of clubs or organizations: Not on file    Relationship status: Not on file  . Intimate partner violence:  Fear of current or ex partner: Not on file    Emotionally abused: Not on file    Physically abused: Not on file    Forced sexual activity: Not on file  Other Topics Concern  . Not on file  Social History Narrative  . Not on file    Allergies:  No Known Allergies  Medications: Prior to Admission medications   Medication Sig Start Date End Date Taking? Authorizing Provider  levothyroxine (SYNTHROID, LEVOTHROID) 75 MCG tablet Take 75 mcg by mouth daily before breakfast.   Yes [provider]    Physical Exam Vitals: Blood pressure 110/66, pulse 74, height 5\' 3"  (1.6 m), weight 119 lb (54 kg).  General: NAD HEENT: normocephalic, anicteric Thyroid: no enlargement, no palpable  nodules Pulmonary: No increased work of breathing, CTAB Cardiovascular: RRR, distal pulses 2+ Breast: Breast symmetrical, no tenderness, no palpable nodules or masses, no skin or nipple retraction present, no nipple discharge.  No axillary or supraclavicular lymphadenopathy. Abdomen: NABS, soft, non-tender, non-distended.  Umbilicus without lesions.  No hepatomegaly, splenomegaly or masses palpable. No evidence of hernia  Genitourinary:  External: Normal external female genitalia.  Normal urethral meatus, normal Bartholin's and Skene's glands.    Vagina: Normal vaginal mucosa, no evidence of prolapse.    Cervix: Grossly normal in appearance, no bleeding, no CMT  Uterus: Non-enlarged, mobile, normal contour.    Adnexa: ovaries non-enlarged, no adnexal masses  Rectal: deferred  Lymphatic: no evidence of inguinal lymphadenopathy Extremities: no edema, erythema, or tenderness Neurologic: Grossly intact Psychiatric: mood appropriate, affect full     Assessment: 57 y.o. G2P2002 routine annual exam  Plan: Problem List Items Addressed This Visit    None    Visit Diagnoses    Well woman exam with routine gynecological exam    -  Primary   Relevant Orders   Cytology - PAP   MM DIGITAL SCREENING BILATERAL   Cervical cancer screening       Relevant Orders   Cytology - PAP   Breast cancer screening       Relevant Orders   MM DIGITAL SCREENING BILATERAL      1) Mammogram - recommend yearly screening mammogram.  Mammogram Was ordered today  2) STI screening  wasoffered and declined  3) ASCCP guidelines and rational discussed.  Patient opts for every 3-5 years screening interval  4) Osteoporosis  - per USPTF routine screening DEXA at age 79 Patient had a normal screen a few years ago   5) Routine healthcare maintenance including cholesterol, diabetes screening discussed managed by PCP  6) Colonoscopy Up to date.  Screening recommended starting at age 10 for average risk  individuals, age 54 for individuals deemed at increased risk (including African Americans) and recommended to continue until age 66.  For patient age 71-85 individualized approach is recommended.  Gold standard screening is via colonoscopy, Cologuard screening is an acceptable alternative for patient unwilling or unable to undergo colonoscopy.  "Colorectal cancer screening for average?risk adults: 2018 guideline update from the American Cancer Society"CA: A Cancer Journal for Clinicians: Aug 25, 2016   7) Return in 1 year (on 10/01/2018) for annual established gyn.   Tresea Mall, CNM   09/30/2017, 9:10 AM

## 2017-10-04 LAB — CYTOLOGY - PAP
Diagnosis: NEGATIVE
HPV: NOT DETECTED

## 2017-10-26 ENCOUNTER — Ambulatory Visit
Admission: RE | Admit: 2017-10-26 | Discharge: 2017-10-26 | Disposition: A | Discharge status: Home / Self Care | Payer: BC Managed Care – PPO | Source: Ambulatory Visit | Attending: Advanced Practice Midwife | Admitting: Advanced Practice Midwife

## 2017-10-26 DIAGNOSIS — Z1239 Encounter for other screening for malignant neoplasm of breast: Secondary | ICD-10-CM

## 2017-10-26 DIAGNOSIS — Z1231 Encounter for screening mammogram for malignant neoplasm of breast: Secondary | ICD-10-CM | POA: Insufficient documentation

## 2017-10-26 DIAGNOSIS — Z01419 Encounter for gynecological examination (general) (routine) without abnormal findings: Secondary | ICD-10-CM | POA: Insufficient documentation

## 2018-10-12 ENCOUNTER — Ambulatory Visit (INDEPENDENT_AMBULATORY_CARE_PROVIDER_SITE_OTHER): Payer: BC Managed Care – PPO | Admitting: Advanced Practice Midwife

## 2018-10-12 ENCOUNTER — Encounter: Payer: Self-pay | Admitting: Advanced Practice Midwife

## 2018-10-12 ENCOUNTER — Other Ambulatory Visit: Payer: Self-pay

## 2018-10-12 VITALS — BP 124/78 | Ht 64.0 in | Wt 118.0 lb

## 2018-10-12 DIAGNOSIS — Z1239 Encounter for other screening for malignant neoplasm of breast: Secondary | ICD-10-CM

## 2018-10-12 DIAGNOSIS — Z01419 Encounter for gynecological examination (general) (routine) without abnormal findings: Secondary | ICD-10-CM

## 2018-10-12 DIAGNOSIS — Z0000 Encounter for general adult medical examination without abnormal findings: Secondary | ICD-10-CM

## 2018-10-12 NOTE — Progress Notes (Signed)
Gynecology Annual Exam  PCP: Armando GangLindley, Cheryl P, FNP  Chief Complaint:  Chief Complaint  Patient presents with  . Annual Exam    History of Present Illness:Patient is a 58 y.o. G2P2002 presents for annual exam. The patient has no gyn complaints today. She reports feeling well and following a healthy lifestyle.   LMP: No LMP recorded. Patient is postmenopausal.  Postcoital Bleeding: no Dysmenorrhea: not applicable  The patient is sexually active. She denies dyspareunia.  The patient does perform self breast exams.  There is no notable family history of breast or ovarian cancer in her family.  The patient wears seatbelts: yes.   The patient has regular exercise: She walks regularly and also cares for her 3 young granddaughters some days. She admits some difficulty with sleeping more than 4 or 5 hours.    The patient denies current symptoms of depression.     Review of Systems: Review of Systems  Constitutional: Negative.   HENT: Negative.   Eyes: Negative.   Respiratory: Negative.   Cardiovascular: Negative.   Gastrointestinal: Negative.   Genitourinary: Negative.   Musculoskeletal: Negative.   Skin: Negative.   Neurological: Negative.   Endo/Heme/Allergies: Negative.   Psychiatric/Behavioral: Negative.     Past Medical History:  Past Medical History:  Diagnosis Date  . Hypothyroidism     Past Surgical History:  Past Surgical History:  Procedure Laterality Date  . BREAST BIOPSY  1992   Left  . COLONOSCOPY N/A 04/21/2015   Procedure: COLONOSCOPY;  Surgeon: Wallace CullensPaul Y Oh, MD;  Location: [facility] ENDOSCOPY;  Service: Gastroenterology;  Laterality: N/A;  . endocervical polyp removal  2005  . ENDOMETRIAL BIOPSY  1998    Gynecologic History:  No LMP recorded. Patient is postmenopausal. Last Pap: 1 year ago Results were:  no abnormalities  Last mammogram: 1 year ago Results were: BI-RAD I  Obstetric History: [id number]: G2P2002  Family History:  Family History  Problem  Relation Age of Onset  . Celiac disease Mother   . Heart disease Father   . Colon cancer Maternal Grandfather 1870  . Breast cancer Neg Hx     Social History:  Social History   Socioeconomic History  . Marital status: Married    Spouse name: Not on file  . Number of children: Not on file  . Years of education: Not on file  . Highest education level: Not on file  Occupational History  . Not on file  Social Needs  . Financial resource strain: Not on file  . Food insecurity    Worry: Not on file    Inability: Not on file  . Transportation needs    Medical: Not on file    Non-medical: Not on file  Tobacco Use  . Smoking status: Never Smoker  . Smokeless tobacco: Never Used  Substance and Sexual Activity  . Alcohol use: No  . Drug use: No  . Sexual activity: Not Currently    Birth control/protection: Post-menopausal  Lifestyle  . Physical activity    Days per week: 3 days    Minutes per session: 30 min  . Stress: Not on file  Relationships  . Social Musicianconnections    Talks on phone: Not on file    Gets together: Not on file    Attends religious service: Not on file    Active member of club or organization: Not on file    Attends meetings of clubs or organizations: Not on file    Relationship status:  Not on file  . Intimate partner violence    Fear of current or ex partner: Not on file    Emotionally abused: Not on file    Physically abused: Not on file    Forced sexual activity: Not on file  Other Topics Concern  . Not on file  Social History Narrative  . Not on file    Allergies:  No Known Allergies  Medications: Prior to Admission medications   Medication Sig Start Date End Date Taking? Authorizing Provider  levothyroxine (SYNTHROID, LEVOTHROID) 75 MCG tablet Take 75 mcg by mouth daily before breakfast.   Yes [provider]    Physical Exam Vitals: Blood pressure 124/78, height 5\' 4"  (1.626 m), weight 118 lb (53.5 kg).  General: NAD HEENT:  normocephalic, anicteric Thyroid: no enlargement, no palpable nodules Pulmonary: No increased work of breathing, CTAB Cardiovascular: RRR, distal pulses 2+ Breast: Breast symmetrical, no tenderness, no palpable nodules or masses, no skin or nipple retraction present, no nipple discharge.  No axillary or supraclavicular lymphadenopathy. Abdomen: NABS, soft, non-tender, non-distended.  Umbilicus without lesions.  No hepatomegaly, splenomegaly or masses palpable. No evidence of hernia  Genitourinary:  External: Normal external female genitalia.  Normal urethral meatus, normal Bartholin's and Skene's glands.    Vagina: Normal vaginal mucosa, no evidence of prolapse.    Cervix: Grossly normal in appearance, no bleeding  Uterus: Non-enlarged, mobile, normal contour.  No CMT  Adnexa: ovaries non-enlarged, no adnexal masses  Rectal: deferred  Lymphatic: no evidence of inguinal lymphadenopathy Extremities: no edema, erythema, or tenderness Neurologic: Grossly intact Psychiatric: mood appropriate, affect full  Female chaperone present for pelvic and breast  portions of the physical exam     Assessment: 58 y.o. G2P2002 routine annual exam  Plan: Problem List Items Addressed This Visit    None    Visit Diagnoses    Well woman exam without gynecological exam    -  Primary   Relevant Orders   MM DIGITAL SCREENING BILATERAL   Breast cancer screening       Relevant Orders   MM DIGITAL SCREENING BILATERAL      1) Mammogram - recommend yearly screening mammogram.  Mammogram Was ordered today  2) STI screening  was offered and declined  3) ASCCP guidelines and rationale discussed.  Patient opts for every 5 years screening interval  4) Osteoporosis  - per USPTF routine screening DEXA at age 25  Consider FDA-approved medical therapies in postmenopausal women and men aged 43 years and older, based on the following: a) A hip or vertebral (clinical or morphometric) fracture b) T-score ? -2.5  at the femoral neck or spine after appropriate evaluation to exclude secondary causes C) Low bone mass (T-score between -1.0 and -2.5 at the femoral neck or spine) and a 10-year probability of a hip fracture ? 3% or a 10-year probability of a major osteoporosis-related fracture ? 20% based on the [place]-adapted WHO algorithm   5) Routine healthcare maintenance including cholesterol, diabetes screening discussed managed by PCP  6) Colonoscopy is up to date (2017).  Screening recommended starting at age 34 for average risk individuals, age 75 for individuals deemed at increased risk (including African Americans) and recommended to continue until age 12.  For patient age 15-85 individualized approach is recommended.  Gold standard screening is via colonoscopy, Cologuard screening is an acceptable alternative for patient unwilling or unable to undergo colonoscopy.  "Colorectal cancer screening for average?risk adults: 2018 guideline update from the American Cancer  Society"CA: A Cancer Journal for Clinicians: Aug 25, 2016   7) Return in about 1 year (around 10/12/2019) for annual established gyn.    Tresea MallJane Jamaar Howes, CNM [facility] 10/12/2018, 9:30 AM

## 2018-10-12 NOTE — Patient Instructions (Signed)
Health Maintenance, Female Adopting a healthy lifestyle and getting preventive care are important in promoting health and wellness. Ask your health care provider about:  The right schedule for you to have regular tests and exams.  Things you can do on your own to prevent diseases and keep yourself healthy. What should I know about diet, weight, and exercise? Eat a healthy diet   Eat a diet that includes plenty of vegetables, fruits, low-fat dairy products, and lean protein.  Do not eat a lot of foods that are high in solid fats, added sugars, or sodium. Maintain a healthy weight Body mass index (BMI) is used to identify weight problems. It estimates body fat based on height and weight. Your health care provider can help determine your BMI and help you achieve or maintain a healthy weight. Get regular exercise Get regular exercise. This is one of the most important things you can do for your health. Most adults should:  Exercise for at least 150 minutes each week. The exercise should increase your heart rate and make you sweat (moderate-intensity exercise).  Do strengthening exercises at least twice a week. This is in addition to the moderate-intensity exercise.  Spend less time sitting. Even light physical activity can be beneficial. Watch cholesterol and blood lipids Have your blood tested for lipids and cholesterol at 58 years of age, then have this test every 5 years. Have your cholesterol levels checked more often if:  Your lipid or cholesterol levels are high.  You are older than 58 years of age.  You are at high risk for heart disease. What should I know about cancer screening? Depending on your health history and family history, you may need to have cancer screening at various ages. This may include screening for:  Breast cancer.  Cervical cancer.  Colorectal cancer.  Skin cancer.  Lung cancer. What should I know about heart disease, diabetes, and high blood  pressure? Blood pressure and heart disease  High blood pressure causes heart disease and increases the risk of stroke. This is more likely to develop in people who have high blood pressure readings, are of African descent, or are overweight.  Have your blood pressure checked: ? Every 3-5 years if you are 18-39 years of age. ? Every year if you are 40 years old or older. Diabetes Have regular diabetes screenings. This checks your fasting blood sugar level. Have the screening done:  Once every three years after age 40 if you are at a normal weight and have a low risk for diabetes.  More often and at a younger age if you are overweight or have a high risk for diabetes. What should I know about preventing infection? Hepatitis B If you have a higher risk for hepatitis B, you should be screened for this virus. Talk with your health care provider to find out if you are at risk for hepatitis B infection. Hepatitis C Testing is recommended for:  Everyone born from 1945 through 1965.  Anyone with known risk factors for hepatitis C. Sexually transmitted infections (STIs)  Get screened for STIs, including gonorrhea and chlamydia, if: ? You are sexually active and are younger than 58 years of age. ? You are older than 58 years of age and your health care provider tells you that you are at risk for this type of infection. ? Your sexual activity has changed since you were last screened, and you are at increased risk for chlamydia or gonorrhea. Ask your health care provider if   you are at risk.  Ask your health care provider about whether you are at high risk for HIV. Your health care provider may recommend a prescription medicine to help prevent HIV infection. If you choose to take medicine to prevent HIV, you should first get tested for HIV. You should then be tested every 3 months for as long as you are taking the medicine. Pregnancy  If you are about to stop having your period (premenopausal) and  you may become pregnant, seek counseling before you get pregnant.  Take 400 to 800 micrograms (mcg) of folic acid every day if you become pregnant.  Ask for birth control (contraception) if you want to prevent pregnancy. Osteoporosis and menopause Osteoporosis is a disease in which the bones lose minerals and strength with aging. This can result in bone fractures. If you are 65 years old or older, or if you are at risk for osteoporosis and fractures, ask your health care provider if you should:  Be screened for bone loss.  Take a calcium or vitamin D supplement to lower your risk of fractures.  Be given hormone replacement therapy (HRT) to treat symptoms of menopause. Follow these instructions at home: Lifestyle  Do not use any products that contain nicotine or tobacco, such as cigarettes, e-cigarettes, and chewing tobacco. If you need help quitting, ask your health care provider.  Do not use street drugs.  Do not share needles.  Ask your health care provider for help if you need support or information about quitting drugs. Alcohol use  Do not drink alcohol if: ? Your health care provider tells you not to drink. ? You are pregnant, may be pregnant, or are planning to become pregnant.  If you drink alcohol: ? Limit how much you use to 0-1 drink a day. ? Limit intake if you are breastfeeding.  Be aware of how much alcohol is in your drink. In the [place] one drink equals one 12 oz bottle of beer (355 mL), one 5 oz glass of wine (148 mL), or one 1 oz glass of hard liquor (44 mL). General instructions  Schedule regular health, dental, and eye exams.  Stay current with your vaccines.  Tell your health care provider if: ? You often feel depressed. ? You have ever been abused or do not feel safe at home. Summary  Adopting a healthy lifestyle and getting preventive care are important in promoting health and wellness.  Follow your health care provider's instructions about healthy  diet, exercising, and getting tested or screened for diseases.  Follow your health care provider's instructions on monitoring your cholesterol and blood pressure. This information is not intended to replace advice given to you by your health care provider. Make sure you discuss any questions you have with your health care provider. Document Released: 09/28/2010 Document Revised: 03/08/2018 Document Reviewed: 03/08/2018 Elsevier Patient Education  2020 [employer]  

## 2018-10-23 ENCOUNTER — Other Ambulatory Visit: Payer: Self-pay | Admitting: Advanced Practice Midwife

## 2018-10-23 DIAGNOSIS — Z1231 Encounter for screening mammogram for malignant neoplasm of breast: Secondary | ICD-10-CM

## 2018-11-23 ENCOUNTER — Ambulatory Visit
Admission: RE | Admit: 2018-11-23 | Discharge: 2018-11-23 | Disposition: A | Discharge status: Home / Self Care | Payer: BC Managed Care – PPO | Source: Ambulatory Visit | Attending: Advanced Practice Midwife | Admitting: Advanced Practice Midwife

## 2018-11-23 DIAGNOSIS — Z1231 Encounter for screening mammogram for malignant neoplasm of breast: Secondary | ICD-10-CM | POA: Insufficient documentation

## 2019-01-02 ENCOUNTER — Other Ambulatory Visit: Payer: Self-pay

## 2019-01-02 DIAGNOSIS — Z20822 Contact with and (suspected) exposure to covid-19: Secondary | ICD-10-CM

## 2019-01-02 DIAGNOSIS — Z20828 Contact with and (suspected) exposure to other viral communicable diseases: Secondary | ICD-10-CM

## 2019-01-04 LAB — NOVEL CORONAVIRUS, NAA: SARS-CoV-2, NAA: NOT DETECTED

## 2019-10-18 ENCOUNTER — Other Ambulatory Visit: Payer: Self-pay

## 2019-10-18 ENCOUNTER — Ambulatory Visit (INDEPENDENT_AMBULATORY_CARE_PROVIDER_SITE_OTHER): Payer: BC Managed Care – PPO | Admitting: Advanced Practice Midwife

## 2019-10-18 ENCOUNTER — Encounter: Payer: Self-pay | Admitting: Advanced Practice Midwife

## 2019-10-18 VITALS — BP 118/78 | Ht 64.0 in | Wt 120.0 lb

## 2019-10-18 DIAGNOSIS — Z1239 Encounter for other screening for malignant neoplasm of breast: Secondary | ICD-10-CM | POA: Diagnosis not present

## 2019-10-18 DIAGNOSIS — Z0000 Encounter for general adult medical examination without abnormal findings: Secondary | ICD-10-CM | POA: Diagnosis not present

## 2019-10-18 NOTE — Progress Notes (Signed)
Gynecology Annual Exam  PCP: Armando Gang, FNP  Chief Complaint:  Chief Complaint  Patient presents with  . Annual Exam    History of Present Illness:Patient is a 59 y.o. G2P2002 presents for annual exam. The patient has no complaints today. She continues with healthy lifestyle. She still has some difficulty with getting a good night's sleep especially during the summer months when she is not working/change in routine, etc. She also admits drinking one caffeineated soda throughout the day.   She will see her PCP for regular labs, thyroid management. Faint murmur heard on today's cardiac exam. She denies any symptoms- palpitations or chest pain. Suggested she bring it to the attention of her PCP at next visit.  LMP: No LMP recorded. Patient is postmenopausal.  Postcoital Bleeding: no Dysmenorrhea: not applicable  The patient is infrequently sexually active. She denies dyspareunia.  The patient does perform self breast exams.  There is no notable family history of breast or ovarian cancer in her family.  The patient wears seatbelts: yes.   The patient has regular exercise: she regularly walks.    The patient denies current symptoms of depression.     Review of Systems: Review of Systems  Constitutional: Negative for chills and fever.  HENT: Negative for congestion, ear discharge, ear pain, hearing loss, sinus pain and sore throat.   Eyes: Negative for blurred vision and double vision.  Respiratory: Negative for cough, shortness of breath and wheezing.   Cardiovascular: Negative for chest pain, palpitations and leg swelling.  Gastrointestinal: Negative for abdominal pain, blood in stool, constipation, diarrhea, heartburn, melena, nausea and vomiting.  Genitourinary: Negative for dysuria, flank pain, frequency, hematuria and urgency.  Musculoskeletal: Negative for back pain, joint pain and myalgias.  Skin: Negative for itching and rash.  Neurological: Negative for  dizziness, tingling, tremors, sensory change, speech change, focal weakness, seizures, loss of consciousness, weakness and headaches.  Endo/Heme/Allergies: Negative for environmental allergies. Does not bruise/bleed easily.  Psychiatric/Behavioral: Negative for depression, hallucinations, memory loss, substance abuse and suicidal ideas. The patient is not nervous/anxious and does not have insomnia.     Past Medical History:  There are no problems to display for this patient.   Past Surgical History:  Past Surgical History:  Procedure Laterality Date  . BREAST BIOPSY  1992   Left  . COLONOSCOPY N/A 04/21/2015   Procedure: COLONOSCOPY;  Surgeon: Wallace Cullens, MD;  Location: [facility] ENDOSCOPY;  Service: Gastroenterology;  Laterality: N/A;  . endocervical polyp removal  2005  . ENDOMETRIAL BIOPSY  1998    Gynecologic History:  No LMP recorded. Patient is postmenopausal. Last Pap: 2 years ago Results were:  no abnormalities  Last mammogram: 1 year ago Results were: BI-RAD I  Obstetric History: [id number]  Family History:  Family History  Problem Relation Age of Onset  . Celiac disease Mother   . Heart disease Father   . Colon cancer Maternal Grandfather 9  . Breast cancer Neg Hx     Social History:  Social History   Socioeconomic History  . Marital status: Married    Spouse name: Not on file  . Number of children: Not on file  . Years of education: Not on file  . Highest education level: Not on file  Occupational History  . Not on file  Tobacco Use  . Smoking status: Never Smoker  . Smokeless tobacco: Never Used  Vaping Use  . Vaping Use: Never used  Substance and Sexual Activity  .  Alcohol use: No  . Drug use: No  . Sexual activity: Not Currently    Birth control/protection: Post-menopausal  Other Topics Concern  . Not on file  Social History Narrative  . Not on file   Social Determinants of Health   Financial Resource Strain:   . Difficulty of Paying Living  Expenses:   Food Insecurity:   . Worried About Programme researcher, broadcasting/film/video in the Last Year:   . Barista in the Last Year:   Transportation Needs:   . Freight forwarder (Medical):   Marland Kitchen Lack of Transportation (Non-Medical):   Physical Activity:   . Days of Exercise per Week:   . Minutes of Exercise per Session:   Stress:   . Feeling of Stress :   Social Connections:   . Frequency of Communication with Friends and Family:   . Frequency of Social Gatherings with Friends and Family:   . Attends Religious Services:   . Active Member of Clubs or Organizations:   . Attends Banker Meetings:   Marland Kitchen Marital Status:   Intimate Partner Violence:   . Fear of Current or Ex-Partner:   . Emotionally Abused:   Marland Kitchen Physically Abused:   . Sexually Abused:     Allergies:  No Known Allergies  Medications: Prior to Admission medications   Medication Sig Start Date End Date Taking? Authorizing Provider  levothyroxine (SYNTHROID, LEVOTHROID) 75 MCG tablet Take 75 mcg by mouth daily before breakfast.   Yes [provider]    Physical Exam Vitals: Blood pressure 118/78, height 5\' 4"  (1.626 m), weight 120 lb (54.4 kg).  General: NAD HEENT: normocephalic, anicteric Thyroid: no enlargement, no palpable nodules Pulmonary: No increased work of breathing, CTAB Cardiovascular: RRR, distal pulses 2+, occasional faint murmur heard Breast: Breast symmetrical, no tenderness, no palpable nodules or masses, no skin or nipple retraction present, no nipple discharge.  No axillary or supraclavicular lymphadenopathy. Abdomen: NABS, soft, non-tender, non-distended.  Umbilicus without lesions.  No hepatomegaly, splenomegaly or masses palpable. No evidence of hernia  Genitourinary: deferred for no concerns/PAP interval Extremities: no edema, erythema, or tenderness Neurologic: Grossly intact Psychiatric: mood appropriate, affect full    Assessment: 59 y.o. G2P2002 routine annual  exam  Plan: Problem List Items Addressed This Visit    None    Visit Diagnoses    Well woman exam without gynecological exam    -  Primary   Relevant Orders   MM DIGITAL SCREENING BILATERAL   Encounter for screening for malignant neoplasm of breast, unspecified screening modality       Relevant Orders   MM DIGITAL SCREENING BILATERAL      1) Mammogram - recommend yearly screening mammogram.  Mammogram Was ordered today  2) STI screening  was offered and declined  3) ASCCP guidelines and rationale discussed.  Patient opts for every 5 years screening interval  4) Osteoporosis  - per USPTF routine screening DEXA at age 43  Consider FDA-approved medical therapies in postmenopausal women and men aged 9 years and older, based on the following: a) A hip or vertebral (clinical or morphometric) fracture b) T-score ? -2.5 at the femoral neck or spine after appropriate evaluation to exclude secondary causes C) Low bone mass (T-score between -1.0 and -2.5 at the femoral neck or spine) and a 10-year probability of a hip fracture ? 3% or a 10-year probability of a major osteoporosis-related fracture ? 20% based on the [place]-adapted WHO algorithm   5)  Routine healthcare maintenance including cholesterol, diabetes screening discussed managed by PCP  6) Colonoscopy is up to date.  Screening recommended starting at age 45 for average risk individuals, age 15 for individuals deemed at increased risk (including African Americans) and recommended to continue until age 52.  For patient age 50-85 individualized approach is recommended.  Gold standard screening is via colonoscopy, Cologuard screening is an acceptable alternative for patient unwilling or unable to undergo colonoscopy.  "Colorectal cancer screening for average?risk adults: 2018 guideline update from the American Cancer Society"CA: A Cancer Journal for Clinicians: Aug 25, 2016   7) Return in about 1 year (around 10/17/2020) for annual  established gyn.    Tresea Mall, CNM [facility] 10/18/2019, 9:01 AM

## 2019-11-27 ENCOUNTER — Other Ambulatory Visit: Payer: Self-pay

## 2019-11-27 ENCOUNTER — Ambulatory Visit
Admission: RE | Admit: 2019-11-27 | Discharge: 2019-11-27 | Disposition: A | Discharge status: Home / Self Care | Payer: BC Managed Care – PPO | Source: Ambulatory Visit | Attending: Advanced Practice Midwife | Admitting: Advanced Practice Midwife

## 2019-11-27 DIAGNOSIS — Z0000 Encounter for general adult medical examination without abnormal findings: Secondary | ICD-10-CM

## 2019-11-27 DIAGNOSIS — Z1231 Encounter for screening mammogram for malignant neoplasm of breast: Secondary | ICD-10-CM | POA: Insufficient documentation

## 2019-11-27 DIAGNOSIS — Z1239 Encounter for other screening for malignant neoplasm of breast: Secondary | ICD-10-CM

## 2020-10-21 ENCOUNTER — Other Ambulatory Visit: Payer: Self-pay | Admitting: Advanced Practice Midwife

## 2020-10-21 ENCOUNTER — Encounter: Payer: Self-pay | Admitting: Advanced Practice Midwife

## 2020-10-21 ENCOUNTER — Ambulatory Visit (INDEPENDENT_AMBULATORY_CARE_PROVIDER_SITE_OTHER): Payer: BC Managed Care – PPO | Admitting: Advanced Practice Midwife

## 2020-10-21 ENCOUNTER — Other Ambulatory Visit: Payer: Self-pay

## 2020-10-21 VITALS — BP 118/62 | HR 62 | Ht 64.0 in | Wt 107.0 lb

## 2020-10-21 DIAGNOSIS — Z1239 Encounter for other screening for malignant neoplasm of breast: Secondary | ICD-10-CM | POA: Diagnosis not present

## 2020-10-21 DIAGNOSIS — Z0000 Encounter for general adult medical examination without abnormal findings: Secondary | ICD-10-CM | POA: Diagnosis not present

## 2020-10-21 DIAGNOSIS — Z1231 Encounter for screening mammogram for malignant neoplasm of breast: Secondary | ICD-10-CM

## 2020-10-21 NOTE — Progress Notes (Signed)
Gynecology Annual Exam  PCP: Armando Gang, FNP  Chief Complaint:  Chief Complaint  Patient presents with   Gynecologic Exam    Annual - no concerns. RM 5    History of Present Illness:Patient is a 60 y.o. [id number] presents for annual exam. The patient has no gyn complaints today. She is grieving the loss of her husband who died 3 months ago. Since then she has had a decreased appetite, she is not sleeping well and she is not getting much exercise. She does have a good support system around her- family and church.   LMP: No LMP recorded. Patient is postmenopausal.   The patient is not sexually active. The patient does perform self breast exams.  There is no notable family history of breast or ovarian cancer in her family.  The patient wears seatbelts: yes.     The patient reports current symptoms of depression due to the loss of her spouse.     Review of Systems: Review of Systems  Constitutional:  Negative for chills and fever.  HENT:  Negative for congestion, ear discharge, ear pain, hearing loss, sinus pain and sore throat.   Eyes:  Negative for blurred vision and double vision.  Respiratory:  Negative for cough, shortness of breath and wheezing.   Cardiovascular:  Negative for chest pain, palpitations and leg swelling.  Gastrointestinal:  Negative for abdominal pain, blood in stool, constipation, diarrhea, heartburn, melena, nausea and vomiting.  Genitourinary:  Negative for dysuria, flank pain, frequency, hematuria and urgency.  Musculoskeletal:  Negative for back pain, joint pain and myalgias.  Skin:  Negative for itching and rash.  Neurological:  Negative for dizziness, tingling, tremors, sensory change, speech change, focal weakness, seizures, loss of consciousness, weakness and headaches.  Endo/Heme/Allergies:  Negative for environmental allergies. Does not bruise/bleed easily.  Psychiatric/Behavioral:  Negative for depression, hallucinations, memory loss,  substance abuse and suicidal ideas. The patient is not nervous/anxious and does not have insomnia.    Past Medical History:  There are no problems to display for this patient.   Past Surgical History:  Past Surgical History:  Procedure Laterality Date   BREAST BIOPSY Left 1992   benign   COLONOSCOPY N/A 04/21/2015   Procedure: COLONOSCOPY;  Surgeon: Wallace Cullens, MD;  Location: [facility] ENDOSCOPY;  Service: Gastroenterology;  Laterality: N/A;   endocervical polyp removal  2005   ENDOMETRIAL BIOPSY  1998    Gynecologic History:  No LMP recorded. Patient is postmenopausal. Last Pap: 3 years ago Results were:  no abnormalities  Last mammogram: 1 year ago Results were: BI-RAD I  Obstetric History: [id number]  Family History:  Family History  Problem Relation Age of Onset   Celiac disease Mother    Heart disease Father    Colon cancer Maternal Grandfather 68   Breast cancer Neg Hx     Social History:  Social History   Socioeconomic History   Marital status: Married    Spouse name: Not on file   Number of children: Not on file   Years of education: Not on file   Highest education level: Not on file  Occupational History   Not on file  Tobacco Use   Smoking status: Never   Smokeless tobacco: Never  Vaping Use   Vaping Use: Never used  Substance and Sexual Activity   Alcohol use: No   Drug use: No   Sexual activity: Not Currently    Birth control/protection: Post-menopausal  Other Topics Concern  Not on file  Social History Narrative   Not on file   Social Determinants of Health   Financial Resource Strain: Not on file  Food Insecurity: Not on file  Transportation Needs: Not on file  Physical Activity: Not on file  Stress: Not on file  Social Connections: Not on file  Intimate Partner Violence: Not on file    Allergies:  No Known Allergies  Medications: Prior to Admission medications   Medication Sig Start Date End Date Taking? Authorizing Provider   levothyroxine (SYNTHROID, LEVOTHROID) 75 MCG tablet Take 75 mcg by mouth daily before breakfast.   Yes [provider]    Physical Exam Vitals: Blood pressure 118/62, pulse 62, height 5\' 4"  (1.626 m), weight 107 lb (48.5 kg).  General: NAD HEENT: normocephalic, anicteric Thyroid: no enlargement, no palpable nodules Pulmonary: No increased work of breathing, CTAB Cardiovascular: RRR, distal pulses 2+ Breast: Breast symmetrical, no tenderness, no palpable nodules or masses, no skin or nipple retraction present, no nipple discharge.  No axillary or supraclavicular lymphadenopathy. Abdomen: NABS, soft, non-tender, non-distended.  Umbilicus without lesions.  No hepatomegaly, splenomegaly or masses palpable. No evidence of hernia  Genitourinary: deferred for no concerns/PAP interval Extremities: no edema, erythema, or tenderness Neurologic: Grossly intact Psychiatric: mood appropriate, affect full   Assessment: 60 y.o. G2P2002 routine annual exam  Plan: Problem List Items Addressed This Visit   None Visit Diagnoses     Well woman exam without gynecological exam    -  Primary   Relevant Orders   MM DIGITAL SCREENING BILATERAL   Screening breast examination       Relevant Orders   MM DIGITAL SCREENING BILATERAL       1) Mammogram - recommend yearly screening mammogram.  Mammogram Was ordered today  2) STI screening  was not offered and therefore not obtained  3) ASCCP guidelines and rationale discussed.  Patient opts for every 5 years screening interval  4) Osteoporosis  - per USPTF routine screening DEXA at age 64  Consider FDA-approved medical therapies in postmenopausal women and men aged 60 years and older, based on the following: a) A hip or vertebral (clinical or morphometric) fracture b) T-score ? -2.5 at the femoral neck or spine after appropriate evaluation to exclude secondary causes C) Low bone mass (T-score between -1.0 and -2.5 at the femoral neck or  spine) and a 10-year probability of a hip fracture ? 3% or a 10-year probability of a major osteoporosis-related fracture ? 20% based on the [place]-adapted WHO algorithm   5) Routine healthcare maintenance including cholesterol, diabetes screening discussed Declines  6) Colonoscopy due in 2027.  Screening recommended starting at age 71 for average risk individuals, age 35 for individuals deemed at increased risk (including African Americans) and recommended to continue until age 60.  For patient age 68-85 individualized approach is recommended.  Gold standard screening is via colonoscopy, Cologuard screening is an acceptable alternative for patient unwilling or unable to undergo colonoscopy.  "Colorectal cancer screening for average?risk adults: 2018 guideline update from the American Cancer Society"CA: A Cancer Journal for Clinicians: Aug 25, 2016   7) Return in about 1 year (around 10/21/2021) for annual established gyn.    10/23/2021, CNM [facility] 10/21/2020, 10:40 AM

## 2020-11-27 ENCOUNTER — Ambulatory Visit
Admission: RE | Admit: 2020-11-27 | Discharge: 2020-11-27 | Disposition: A | Discharge status: Home / Self Care | Payer: BC Managed Care – PPO | Source: Ambulatory Visit | Attending: Advanced Practice Midwife | Admitting: Advanced Practice Midwife

## 2020-11-27 ENCOUNTER — Other Ambulatory Visit: Payer: Self-pay

## 2020-11-27 DIAGNOSIS — Z1231 Encounter for screening mammogram for malignant neoplasm of breast: Secondary | ICD-10-CM | POA: Diagnosis not present

## 2021-11-03 ENCOUNTER — Ambulatory Visit: Payer: BC Managed Care – PPO | Admitting: Obstetrics & Gynecology

## 2021-11-09 ENCOUNTER — Encounter: Payer: Self-pay | Admitting: Obstetrics & Gynecology

## 2021-11-09 ENCOUNTER — Ambulatory Visit (INDEPENDENT_AMBULATORY_CARE_PROVIDER_SITE_OTHER): Payer: BC Managed Care – PPO | Admitting: Obstetrics & Gynecology

## 2021-11-09 ENCOUNTER — Other Ambulatory Visit: Payer: Self-pay | Admitting: Advanced Practice Midwife

## 2021-11-09 ENCOUNTER — Other Ambulatory Visit (HOSPITAL_COMMUNITY)
Admission: RE | Admit: 2021-11-09 | Discharge: 2021-11-09 | Disposition: A | Discharge status: Home / Self Care | Payer: BC Managed Care – PPO | Source: Ambulatory Visit | Attending: Obstetrics & Gynecology | Admitting: Obstetrics & Gynecology

## 2021-11-09 VITALS — BP 110/70 | HR 60 | Resp 15 | Ht 60.0 in | Wt 109.4 lb

## 2021-11-09 DIAGNOSIS — Z1231 Encounter for screening mammogram for malignant neoplasm of breast: Secondary | ICD-10-CM

## 2021-11-09 DIAGNOSIS — Z124 Encounter for screening for malignant neoplasm of cervix: Secondary | ICD-10-CM

## 2021-11-11 LAB — CYTOLOGY - PAP
Comment: NEGATIVE
Diagnosis: NEGATIVE
High risk HPV: NEGATIVE

## 2021-12-02 ENCOUNTER — Ambulatory Visit
Admission: RE | Admit: 2021-12-02 | Discharge: 2021-12-02 | Disposition: A | Discharge status: Home / Self Care | Payer: BC Managed Care – PPO | Source: Ambulatory Visit | Attending: Advanced Practice Midwife | Admitting: Advanced Practice Midwife

## 2021-12-02 DIAGNOSIS — Z1231 Encounter for screening mammogram for malignant neoplasm of breast: Secondary | ICD-10-CM | POA: Diagnosis not present

## 2021-12-26 NOTE — Progress Notes (Signed)
Subjective:     Latasha Taylor is a 61 y.o. female here for a routine exam.  Current complaints: screening for cervical cancer.  Personal health questionnaire reviewed: yes.   Gynecologic History No LMP recorded. Patient is postmenopausal. Contraception: none Last Pap: 09/30/2017. Results were: normal Last mammogram: 12/04/2020. Results were: normal  Obstetric History OB History  Gravida Para Term Preterm AB Living  2 2 2     2   SAB IAB Ectopic Multiple Live Births          2    # Outcome Date GA Lbr Len/2nd Weight Sex Delivery Anes PTL Lv  2 Term 08/10/88   6 lb 14 oz (3.118 kg) F   N LIV  1 Term 10/14/85   5 lb 11 oz (2.58 kg) F   N LIV     The following portions of the patient's history were reviewed and updated as appropriate: allergies, current medications, past family history, past medical history, past social history, past surgical history, and problem list.  Review of Systems A comprehensive review of systems was negative.    Objective:    BP 110/70   Pulse 60   Resp 15   Ht 5' (1.524 m)   Wt 109 lb 6.4 oz (49.6 kg)   SpO2 97%   BMI 21.37 kg/m   General Appearance:    Alert, cooperative, no distress, appears stated age  Head:    Normocephalic, without obvious abnormality, atraumatic  Eyes:    PERRL, conjunctiva/corneas clear, EOM's intact, fundi    benign, both eyes  Ears:    Normal TM's and external ear canals, both ears  Nose:   Nares normal, septum midline, mucosa normal, no drainage    or sinus tenderness  Throat:   Lips, mucosa, and tongue normal; teeth and gums normal  Neck:   Supple, symmetrical, trachea midline, no adenopathy;    thyroid:  no enlargement/tenderness/nodules; no carotid   bruit or JVD  Back:     Symmetric, no curvature, ROM normal, no CVA tenderness  Lungs:     Clear to auscultation bilaterally, respirations unlabored  Chest Wall:    No tenderness or deformity   Heart:    Regular rate and rhythm, S1 and S2 normal, no murmur, rub   or  gallop  Breast Exam:    No tenderness, masses, or nipple abnormality  Abdomen:     Soft, non-tender, bowel sounds active all four quadrants,    no masses, no organomegaly  Genitalia:    Normal female without lesion, discharge or tenderness  Rectal:    Normal tone, normal prostate, no masses or tenderness;   guaiac negative stool  Extremities:   Extremities normal, atraumatic, no cyanosis or edema  Pulses:   2+ and symmetric all extremities  Skin:   Skin color, texture, turgor normal, no rashes or lesions  Lymph nodes:   Cervical, supraclavicular, and axillary nodes normal  Neurologic:   CNII-XII intact, normal strength, sensation and reflexes    throughout      Assessment:    Healthy female exam.    Plan:    Cytology orfered

## 2022-02-05 LAB — HM DEXA SCAN

## 2022-02-28 LAB — COLOGUARD: Cologuard: NEGATIVE

## 2022-07-04 IMAGING — MG MM DIGITAL SCREENING BILAT W/ TOMO AND CAD
6 of 10 series · 6 of 30 positions shown · non-contrast
Comparison: Previous exam(s).

CLINICAL DATA: Screening.

EXAM:
DIGITAL SCREENING BILATERAL MAMMOGRAM WITH TOMOSYNTHESIS AND CAD
TECHNIQUE: Bilateral screening digital craniocaudal and mediolateral oblique
mammograms were obtained. Bilateral screening digital breast
tomosynthesis was performed. The images were evaluated with
computer-aided detection.

[L MLO synth-2D]
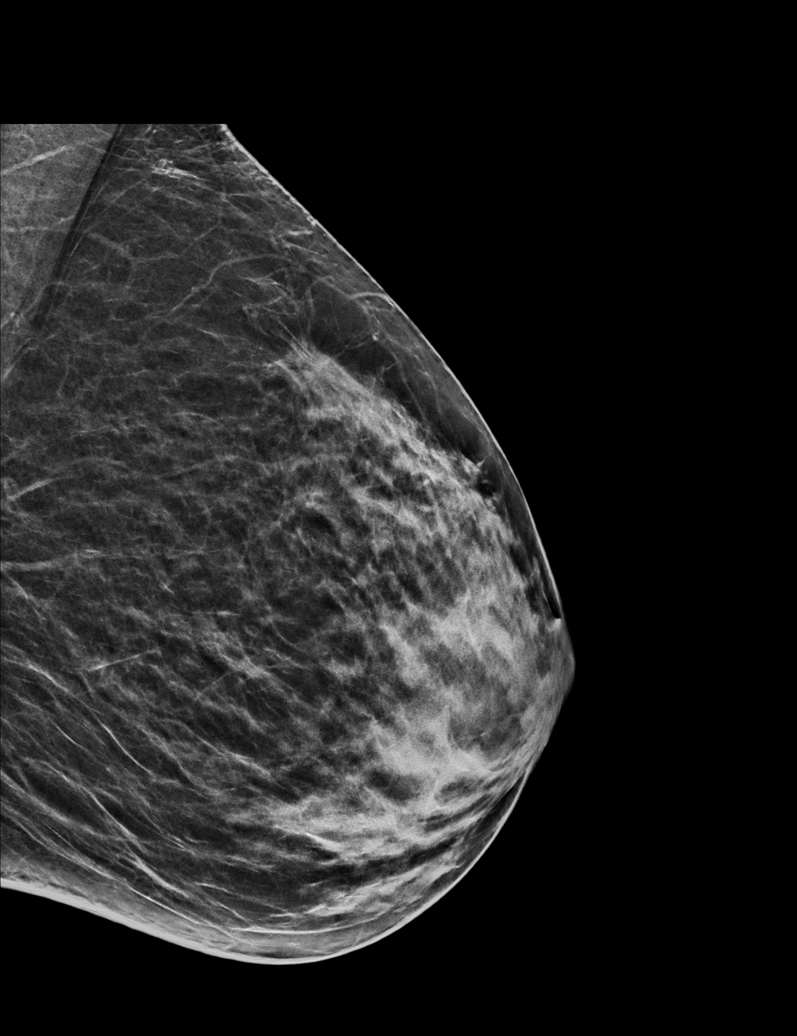

[L CC synth-2D]
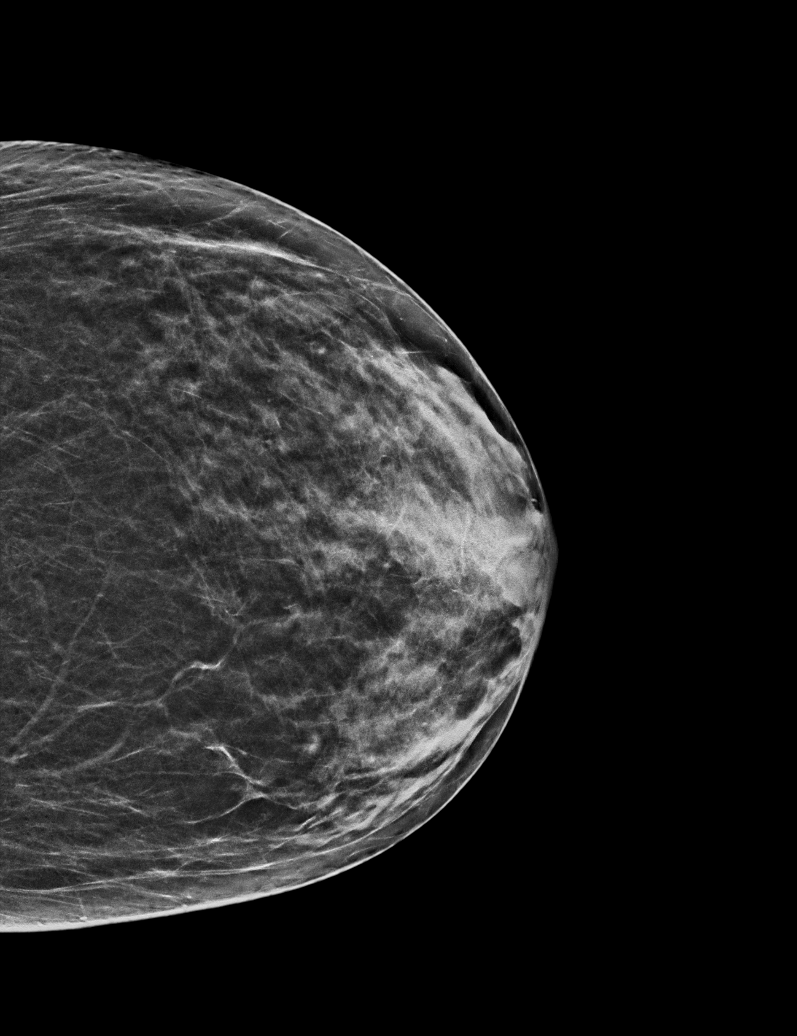

[R MLO synth-2D (1 of 2)]
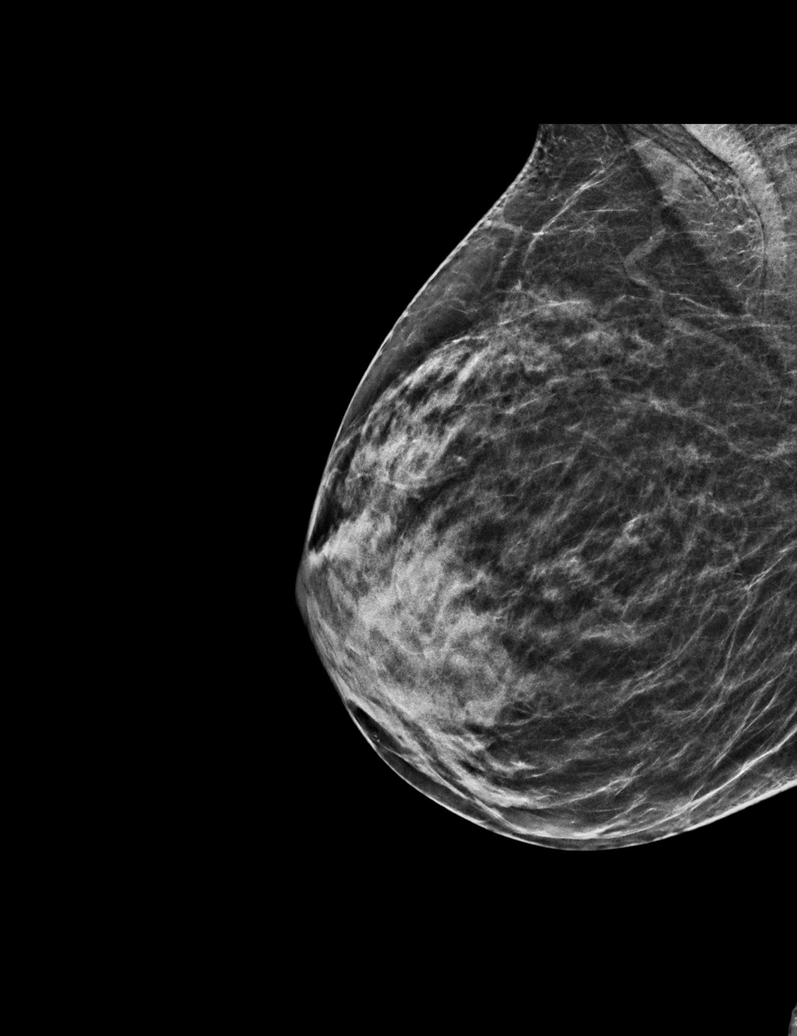

[R CC synth-2D]
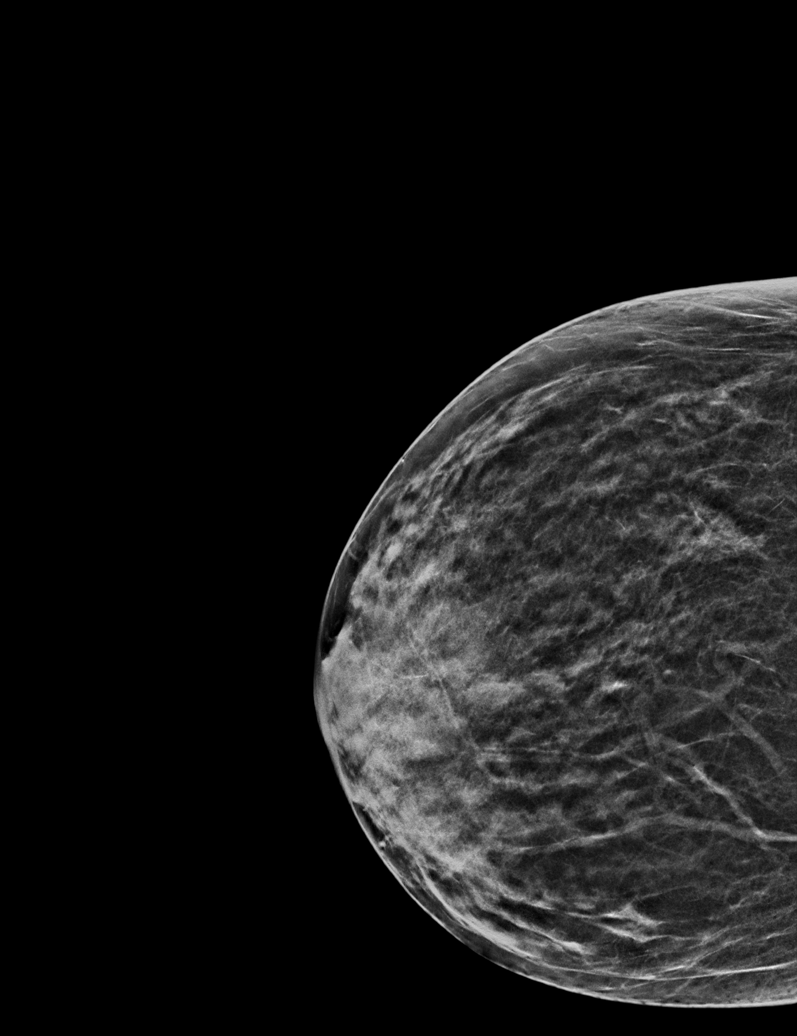

[R MLO synth-2D (2 of 2)]
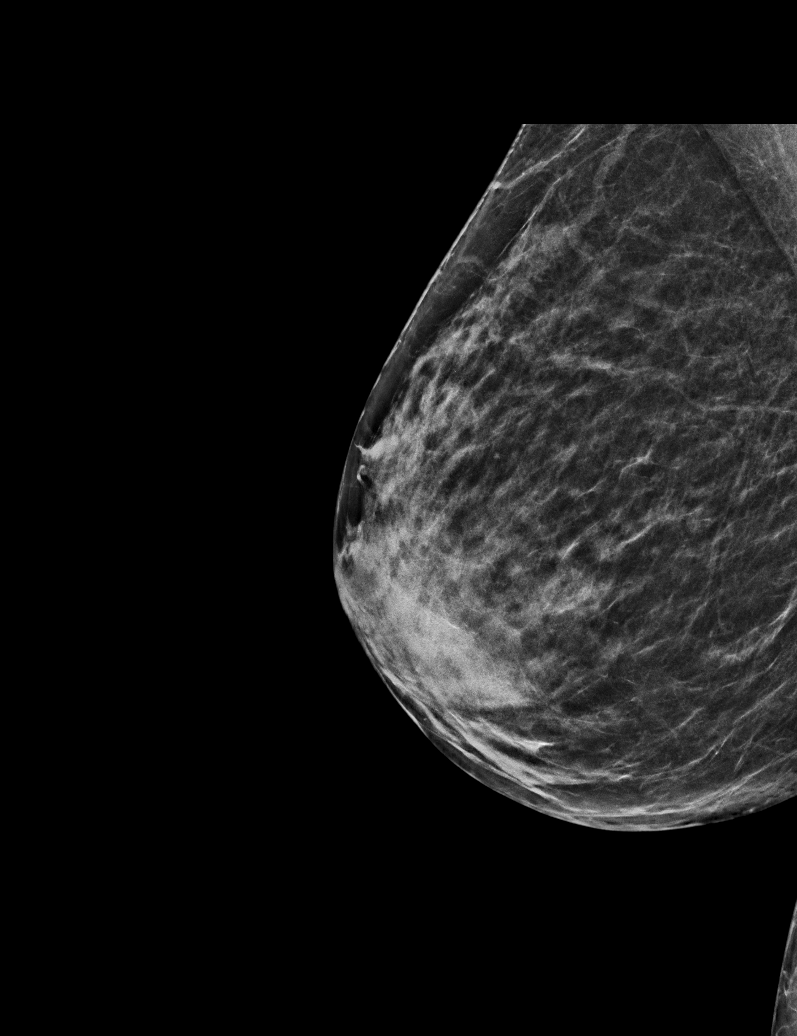

[L CC tomo · tomo slice 25/48.0]
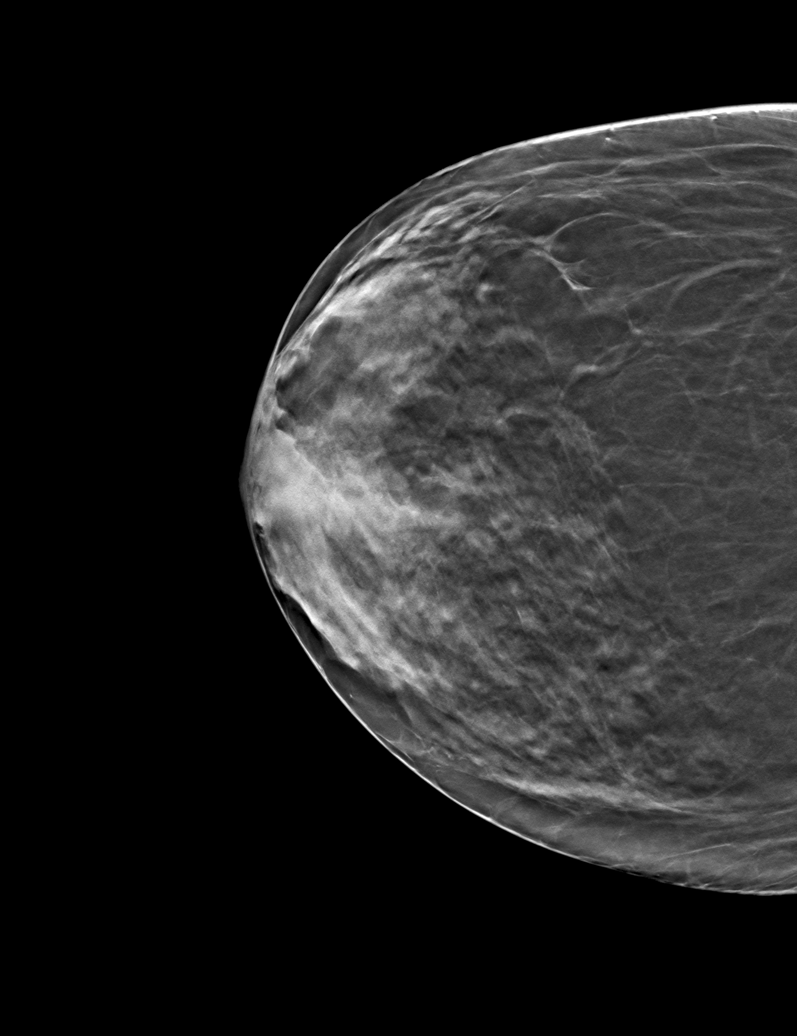

[6 of 30 positions shown; findings below may reference images not displayed]

ACR Breast Density Category c: The breast tissue is heterogeneously
dense, which may obscure small masses.
FINDINGS: There are no findings suspicious for malignancy.
IMPRESSION: No mammographic evidence of malignancy. A result letter of this
screening mammogram will be mailed directly to the patient.

RECOMMENDATION:
Screening mammogram in one year. (Code:Q3-W-BC3)

BI-RADS CATEGORY  1: Negative.

## 2022-10-12 LAB — COMPREHENSIVE METABOLIC PANEL
Albumin: 4.2 (ref 3.5–5.0)
Calcium: 9.1 (ref 8.7–10.7)
Globulin: 2
eGFR: 69

## 2022-10-12 LAB — HEPATIC FUNCTION PANEL
ALT: 12 U/L (ref 7–35)
AST: 19 (ref 13–35)
Alkaline Phosphatase: 50 (ref 25–125)
Bilirubin, Total: 0.4

## 2022-10-12 LAB — BASIC METABOLIC PANEL
BUN: 21 (ref 4–21)
Chloride: 102 (ref 99–108)
Creatinine: 0.9 (ref 0.5–1.1)
Glucose: 74
Potassium: 4.7 meq/L (ref 3.5–5.1)
Sodium: 139 (ref 137–147)

## 2022-10-12 LAB — VITAMIN D 25 HYDROXY (VIT D DEFICIENCY, FRACTURES): Vit D, 25-Hydroxy: 44.6

## 2022-10-13 LAB — TSH: TSH: 4.18 (ref 0.41–5.90)

## 2022-10-28 ENCOUNTER — Ambulatory Visit: Payer: BC Managed Care – PPO | Admitting: Family Medicine

## 2022-10-28 ENCOUNTER — Ambulatory Visit: Payer: BC Managed Care – PPO | Admitting: Nurse Practitioner

## 2022-11-05 ENCOUNTER — Ambulatory Visit: Payer: BC Managed Care – PPO | Admitting: Family Medicine

## 2022-11-05 ENCOUNTER — Encounter: Payer: Self-pay | Admitting: Family Medicine

## 2022-11-05 VITALS — BP 118/72 | HR 61 | Temp 98.0°F | Resp 16 | Ht 60.0 in | Wt 111.2 lb

## 2022-11-05 DIAGNOSIS — Z1159 Encounter for screening for other viral diseases: Secondary | ICD-10-CM

## 2022-11-05 DIAGNOSIS — E785 Hyperlipidemia, unspecified: Secondary | ICD-10-CM

## 2022-11-05 DIAGNOSIS — E063 Autoimmune thyroiditis: Secondary | ICD-10-CM

## 2022-11-05 DIAGNOSIS — Z13220 Encounter for screening for lipoid disorders: Secondary | ICD-10-CM | POA: Diagnosis not present

## 2022-11-05 DIAGNOSIS — Z114 Encounter for screening for human immunodeficiency virus [HIV]: Secondary | ICD-10-CM

## 2022-11-05 DIAGNOSIS — E538 Deficiency of other specified B group vitamins: Secondary | ICD-10-CM

## 2022-11-05 DIAGNOSIS — E559 Vitamin D deficiency, unspecified: Secondary | ICD-10-CM

## 2022-11-05 DIAGNOSIS — M8588 Other specified disorders of bone density and structure, other site: Secondary | ICD-10-CM

## 2022-11-05 DIAGNOSIS — R7309 Other abnormal glucose: Secondary | ICD-10-CM

## 2022-11-05 DIAGNOSIS — E038 Other specified hypothyroidism: Secondary | ICD-10-CM

## 2022-11-05 DIAGNOSIS — F39 Unspecified mood [affective] disorder: Secondary | ICD-10-CM

## 2022-11-05 NOTE — Patient Instructions (Signed)
It was a pleasure meeting you today. Thank you for allowing me to take part in your health care.  Our goals for today as we discussed include:  Schedule fasting lab appointment  Continue current medication  Follow up with OBGYN for PAP and Mammogram   Follow up in 6 months   If you have any questions or concerns, please do not hesitate to call the office at [phone].  I look forward to our next visit and until then take care and stay safe.  Regards,   Dana Allan, MD   [facility]

## 2022-11-05 NOTE — Progress Notes (Signed)
SUBJECTIVE:   Chief Complaint  Patient presents with   Establish Care   HPI Presents to clinic to establish care  No acute concerns today  Mood disorder. Takes Celexa 10 mg daily.  Has been on medication for 1 year and reports helping symptoms of depression.  Denies SI/HI  History of Hashimoto Thyroiditis Initially taking Levothyroxine 75 mcg, was decreased to 50 mcg Monday-Saturday and 75 mcg on Sunday.  Recent TSH 4.18. Followed by former PCP.  Recent Dexa 02/05/22 T score -1.6, Osteopenia Not currently on Calcium or Vitamin D supplements   PERTINENT PMH / PSH: Mood disorder Hashimoto Thyroiditis Osteopenia  OBJECTIVE:  BP 118/72   Pulse 61   Temp 98 F (36.7 C)   Resp 16   Ht 5' (1.524 m)   Wt 111 lb 4 oz (50.5 kg)   SpO2 98%   BMI 21.73 kg/m    Physical Exam Vitals reviewed.  Constitutional:      General: She is not in acute distress.    Appearance: She is not ill-appearing.  HENT:     Head: Normocephalic.     Right Ear: Tympanic membrane, ear canal and external ear normal.     Left Ear: Tympanic membrane, ear canal and external ear normal.     Nose: Nose normal.     Mouth/Throat:     Mouth: Mucous membranes are moist.  Eyes:     Extraocular Movements: Extraocular movements intact.     Conjunctiva/sclera: Conjunctivae normal.     Pupils: Pupils are equal, round, and reactive to light.  Neck:     Thyroid: No thyromegaly or thyroid tenderness.     Vascular: No carotid bruit.  Cardiovascular:     Rate and Rhythm: Normal rate and regular rhythm.     Pulses: Normal pulses.     Heart sounds: Normal heart sounds.  Pulmonary:     Effort: Pulmonary effort is normal.     Breath sounds: Normal breath sounds.  Abdominal:     General: Bowel sounds are normal. There is no distension.     Palpations: Abdomen is soft.     Tenderness: There is no abdominal tenderness. There is no right CVA tenderness, left CVA tenderness, guarding or rebound.   Musculoskeletal:        General: Normal range of motion.     Cervical back: Normal range of motion.     Right lower leg: No edema.     Left lower leg: No edema.  Lymphadenopathy:     Cervical: No cervical adenopathy.  Skin:    Capillary Refill: Capillary refill takes less than 2 seconds.  Neurological:     General: No focal deficit present.     Mental Status: She is alert and oriented to person, place, and time. Mental status is at baseline.     Motor: No weakness.  Psychiatric:        Mood and Affect: Mood normal.        Behavior: Behavior normal.        Thought Content: Thought content normal.        Judgment: Judgment normal.        11/05/2022    9:13 AM  Depression screen PHQ 2/9  Decreased Interest 0  Down, Depressed, Hopeless 0  PHQ - 2 Score 0  Altered sleeping 1  Tired, decreased energy 1  Change in appetite 0  Feeling bad or failure about yourself  0  Trouble concentrating 0  Moving slowly  or fidgety/restless 0  Suicidal thoughts 0  PHQ-9 Score 2  Difficult doing work/chores Not difficult at all      11/05/2022    9:13 AM  GAD 7 : Generalized Anxiety Score  Nervous, Anxious, on Edge 0  Control/stop worrying 0  Worry too much - different things 0  Trouble relaxing 0  Restless 0  Easily annoyed or irritable 0  Afraid - awful might happen 0  Total GAD 7 Score 0  Anxiety Difficulty Not difficult at all    ASSESSMENT/PLAN:  Need for hepatitis C screening test -     Hepatitis C antibody; Future  Encounter for screening for HIV -     HIV Antibody (routine testing w rflx); Future  Vitamin B 12 deficiency -     Vitamin B12; Future  Lipid screening -     Lipid panel; Future  Abnormal glucose -     Hemoglobin A1c; Future  Mood disorder [facility]) Assessment & Plan: Chronic Doing well on current SSRI Low PHQ9/GAD Denies SI/HI Continue Celexa 20 mg daily Encourage CBT Follow up as needed  Orders: -     CBC with Differential/Platelet;  Future  Hypothyroidism due to Hashimoto's thyroiditis Assessment & Plan: Chronic.  9 Followed by former PCP Recent TSH 4.16,  Recent TPO and Thyroglobulin remains elevated.  Asymptomatic Treated with Levothyroxine 50 mcg daily Mon-Sat and 75 mcg on Sunday. Will discuss referral to Endocrinology as next visit.      Osteopenia of lumbar spine Assessment & Plan: Dexa completed 02/05/2022, Osteopenia.  T score -1.6.   Recommend Calcium and Vitamin D supplements daily.   Vitamin D deficiency Assessment & Plan: Recent level low normal Recommend daily supplements   Hyperlipidemia, unspecified hyperlipidemia type Assessment & Plan: The 10-year ASCVD risk score (Arnett DK, et al., 2019) is: 3%  No indication for statin therapy Encourage healthy lifestyle and increase activity   HCM Colonoscopy up to date. Due 2025 Declined Tetanus, PNA, Flu Shingles completed Mammogram up to date. Due 2025 Regular self breast exams PAP up to date.  Due 10/2026.  Follows with OBGYN   PDMP reviewed  Return in about 6 months (around 05/08/2023), or if symptoms worsen or fail to improve.  Dana Allan, MD

## 2022-11-09 ENCOUNTER — Other Ambulatory Visit (INDEPENDENT_AMBULATORY_CARE_PROVIDER_SITE_OTHER): Payer: BC Managed Care – PPO

## 2022-11-09 DIAGNOSIS — F39 Unspecified mood [affective] disorder: Secondary | ICD-10-CM

## 2022-11-09 DIAGNOSIS — Z13220 Encounter for screening for lipoid disorders: Secondary | ICD-10-CM

## 2022-11-09 DIAGNOSIS — Z114 Encounter for screening for human immunodeficiency virus [HIV]: Secondary | ICD-10-CM

## 2022-11-09 DIAGNOSIS — E538 Deficiency of other specified B group vitamins: Secondary | ICD-10-CM | POA: Diagnosis not present

## 2022-11-09 DIAGNOSIS — R7309 Other abnormal glucose: Secondary | ICD-10-CM

## 2022-11-09 DIAGNOSIS — Z1159 Encounter for screening for other viral diseases: Secondary | ICD-10-CM

## 2022-11-09 LAB — CBC WITH DIFFERENTIAL/PLATELET
Basophils Absolute: 0 10*3/uL (ref 0.0–0.1)
Basophils Relative: 1 % (ref 0.0–3.0)
Eosinophils Absolute: 0.3 10*3/uL (ref 0.0–0.7)
Eosinophils Relative: 6.1 % — ABNORMAL HIGH (ref 0.0–5.0)
HCT: 39.4 % (ref 36.0–46.0)
Hemoglobin: 12.9 g/dL (ref 12.0–15.0)
Lymphocytes Relative: 32.3 % (ref 12.0–46.0)
Lymphs Abs: 1.6 10*3/uL (ref 0.7–4.0)
MCHC: 32.8 g/dL (ref 30.0–36.0)
MCV: 95.1 fl (ref 78.0–100.0)
Monocytes Absolute: 0.5 10*3/uL (ref 0.1–1.0)
Monocytes Relative: 11.2 % (ref 3.0–12.0)
Neutro Abs: 2.4 10*3/uL (ref 1.4–7.7)
Neutrophils Relative %: 49.4 % (ref 43.0–77.0)
Platelets: 218 10*3/uL (ref 150.0–400.0)
RBC: 4.15 Mil/uL (ref 3.87–5.11)
RDW: 13.4 % (ref 11.5–15.5)
WBC: 4.8 10*3/uL (ref 4.0–10.5)

## 2022-11-09 LAB — LIPID PANEL
Cholesterol: 205 mg/dL — ABNORMAL HIGH (ref 0–200)
HDL: 76.6 mg/dL (ref >39.00)
LDL Cholesterol: 118 mg/dL — ABNORMAL HIGH (ref 0–99)
NonHDL: 128.6
Total CHOL/HDL Ratio: 3
Triglycerides: 52 mg/dL (ref 0.0–149.0)
VLDL: 10.4 mg/dL (ref 0.0–40.0)

## 2022-11-09 LAB — HEMOGLOBIN A1C: Hgb A1c MFr Bld: 5.9 % (ref 4.6–6.5)

## 2022-11-09 LAB — VITAMIN B12: Vitamin B-12: 317 pg/mL (ref 211–911)

## 2022-11-11 ENCOUNTER — Encounter: Payer: Self-pay | Admitting: Certified Nurse Midwife

## 2022-11-11 ENCOUNTER — Ambulatory Visit (INDEPENDENT_AMBULATORY_CARE_PROVIDER_SITE_OTHER): Payer: BC Managed Care – PPO | Admitting: Certified Nurse Midwife

## 2022-11-11 VITALS — BP 120/70 | HR 58 | Ht 60.0 in | Wt 112.0 lb

## 2022-11-11 DIAGNOSIS — Z1231 Encounter for screening mammogram for malignant neoplasm of breast: Secondary | ICD-10-CM

## 2022-11-11 DIAGNOSIS — Z01419 Encounter for gynecological examination (general) (routine) without abnormal findings: Secondary | ICD-10-CM | POA: Diagnosis not present

## 2022-11-11 NOTE — Patient Instructions (Signed)

## 2022-11-11 NOTE — Progress Notes (Signed)
ANNUAL EXAM Patient name: Latasha Taylor MRN [id number]  Date of birth: [birth year] Chief Complaint:   Annual Exam  History of Present Illness:   Latasha Taylor is a 62 y.o. G39P2002 Caucasian female being seen today for a routine annual exam.  Current complaints: none  No LMP recorded. Patient is postmenopausal.   The pregnancy intention screening data noted above was reviewed. Potential methods of contraception were discussed. The patient elected to proceed with No data recorded.      Component Value Date/Time   DIAGPAP  11/09/2021 0917    - Negative for intraepithelial lesion or malignancy (NILM)   DIAGPAP  09/30/2017 0000    NEGATIVE FOR INTRAEPITHELIAL LESIONS OR MALIGNANCY.   HPVHIGH Negative 11/09/2021 0917   ADEQPAP  11/09/2021 0917    Satisfactory for evaluation; transformation zone component PRESENT.   ADEQPAP  09/30/2017 0000    Satisfactory for evaluation. The presence or absence of an endocervical / transformation zone component cannot be determined because of atrophy.   Last mammogram: 9/23. Results were: normal. Family h/o breast cancer: no Last Colon cancer screen: Cologard completed & negative 2023, next due 2026. Family h/o colorectal cancer: no     11/05/2022    9:13 AM  Depression screen PHQ 2/9  Decreased Interest 0  Down, Depressed, Hopeless 0  PHQ - 2 Score 0  Altered sleeping 1  Tired, decreased energy 1  Change in appetite 0  Feeling bad or failure about yourself  0  Trouble concentrating 0  Moving slowly or fidgety/restless 0  Suicidal thoughts 0  PHQ-9 Score 2  Difficult doing work/chores Not difficult at all        11/05/2022    9:13 AM  GAD 7 : Generalized Anxiety Score  Nervous, Anxious, on Edge 0  Control/stop worrying 0  Worry too much - different things 0  Trouble relaxing 0  Restless 0  Easily annoyed or irritable 0  Afraid - awful might happen 0  Total GAD 7 Score 0  Anxiety Difficulty Not difficult at all     Review of  Systems:   Pertinent items are noted in HPI Denies any headaches, blurred vision, fatigue, shortness of breath, chest pain, abdominal pain, abnormal vaginal discharge/itching/odor/irritation, problems with periods, bowel movements, urination, or intercourse unless otherwise stated above. Pertinent History Reviewed:  Reviewed past medical,surgical, social and family history.  Reviewed problem list, medications and allergies. Physical Assessment:   Vitals:   11/11/22 0818  BP: 120/70  Pulse: (!) 58  Weight: 112 lb (50.8 kg)  Height: 5' (1.524 m)  Body mass index is 21.87 kg/m.       Physical Exam Vitals reviewed.  Constitutional:      Appearance: Normal appearance.  HENT:     Head: Normocephalic.  Neck:     Thyroid: No thyroid mass or thyromegaly.  Cardiovascular:     Rate and Rhythm: Normal rate and regular rhythm.     Heart sounds: Normal heart sounds.  Pulmonary:     Effort: Pulmonary effort is normal.     Breath sounds: Normal breath sounds.  Chest:  Breasts:    Tanner Score is 5.     Right: Normal.     Left: Normal.  Abdominal:     General: Abdomen is flat.     Palpations: Abdomen is soft.     Tenderness: There is no abdominal tenderness.  Musculoskeletal:     Cervical back: Neck supple. No tenderness.  Skin:  General: Skin is warm and dry.  Neurological:     General: No focal deficit present.     Mental Status: She is alert and oriented to person, place, and time.  Psychiatric:        Mood and Affect: Mood normal.        Behavior: Behavior normal.     No results found for this or any previous visit (from the past 24 hour(s)).  Assessment & Plan:  1. Well woman exam with routine gynecological exam  2. Screening mammogram for breast cancer - MM 3D SCREENING MAMMOGRAM BILATERAL BREAST; Future - Pap up to date, no hx of abnormals, discussed screening intervals, next due 2027, however will be >65 at that time. Screening labs completed with PCP. Mammogram  ordered. Mammogram: schedule screening mammo as soon as possible, or sooner if problems Colonoscopy:  Cologard due 2026 , or sooner if problems  Orders Placed This Encounter  Procedures   MM 3D SCREENING MAMMOGRAM BILATERAL BREAST    Meds: No orders of the defined types were placed in this encounter.   Follow-up: Return in 1 year (on 11/11/2023).  Dominica Severin, CNM 11/11/2022 9:00 AM

## 2022-11-21 ENCOUNTER — Encounter: Payer: Self-pay | Admitting: Family Medicine

## 2022-11-21 DIAGNOSIS — E559 Vitamin D deficiency, unspecified: Secondary | ICD-10-CM | POA: Insufficient documentation

## 2022-11-21 DIAGNOSIS — E039 Hypothyroidism, unspecified: Secondary | ICD-10-CM | POA: Insufficient documentation

## 2022-11-21 DIAGNOSIS — R7309 Other abnormal glucose: Secondary | ICD-10-CM | POA: Insufficient documentation

## 2022-11-21 DIAGNOSIS — E785 Hyperlipidemia, unspecified: Secondary | ICD-10-CM | POA: Insufficient documentation

## 2022-11-21 DIAGNOSIS — Z114 Encounter for screening for human immunodeficiency virus [HIV]: Secondary | ICD-10-CM | POA: Insufficient documentation

## 2022-11-21 DIAGNOSIS — E538 Deficiency of other specified B group vitamins: Secondary | ICD-10-CM | POA: Insufficient documentation

## 2022-11-21 DIAGNOSIS — M8580 Other specified disorders of bone density and structure, unspecified site: Secondary | ICD-10-CM | POA: Insufficient documentation

## 2022-11-21 DIAGNOSIS — Z13220 Encounter for screening for lipoid disorders: Secondary | ICD-10-CM | POA: Insufficient documentation

## 2022-11-21 DIAGNOSIS — Z1159 Encounter for screening for other viral diseases: Secondary | ICD-10-CM | POA: Insufficient documentation

## 2022-11-21 DIAGNOSIS — F39 Unspecified mood [affective] disorder: Secondary | ICD-10-CM | POA: Insufficient documentation

## 2022-11-21 NOTE — Assessment & Plan Note (Signed)
Chronic Doing well on current SSRI Low PHQ9/GAD Denies SI/HI Continue Celexa 20 mg daily Encourage CBT Follow up as needed

## 2022-11-21 NOTE — Assessment & Plan Note (Signed)
Chronic.  9 Followed by former PCP Recent TSH 4.16,  Recent TPO and Thyroglobulin remains elevated.  Asymptomatic Treated with Levothyroxine 50 mcg daily Mon-Sat and 75 mcg on Sunday. Will discuss referral to Endocrinology as next visit.

## 2022-11-21 NOTE — Assessment & Plan Note (Signed)
Recent level low normal Recommend daily supplements

## 2022-11-21 NOTE — Assessment & Plan Note (Signed)
Dexa completed 02/05/2022, Osteopenia.  T score -1.6.   Recommend Calcium and Vitamin D supplements daily.

## 2022-11-21 NOTE — Assessment & Plan Note (Signed)
The 10-year ASCVD risk score (Arnett DK, et al., 2019) is: 3%  No indication for statin therapy Encourage healthy lifestyle and increase activity

## 2022-11-23 ENCOUNTER — Telehealth: Payer: Self-pay | Admitting: Family Medicine

## 2022-11-23 NOTE — Telephone Encounter (Signed)
Called patient reviewed all information and repeated back to me. Will call if any questions.  ? ?

## 2022-11-23 NOTE — Telephone Encounter (Signed)
Pt returned [facility] CMA call. Note under labs from provider was read to pt. Pt aware but would like to f/u with a nurse. Pt has some questions regarding blood work. Her best contact number is [phone].

## 2022-12-07 ENCOUNTER — Ambulatory Visit
Admission: RE | Admit: 2022-12-07 | Discharge: 2022-12-07 | Disposition: A | Discharge status: Home / Self Care | Payer: BC Managed Care – PPO | Source: Ambulatory Visit | Attending: Certified Nurse Midwife | Admitting: Certified Nurse Midwife

## 2022-12-07 DIAGNOSIS — Z1231 Encounter for screening mammogram for malignant neoplasm of breast: Secondary | ICD-10-CM | POA: Insufficient documentation

## 2022-12-20 LAB — COLOGUARD: Cologuard: NEGATIVE

## 2023-04-18 ENCOUNTER — Other Ambulatory Visit: Payer: Self-pay | Admitting: Family Medicine

## 2023-04-18 ENCOUNTER — Telehealth: Payer: Self-pay

## 2023-04-18 ENCOUNTER — Encounter: Payer: Self-pay | Admitting: Family Medicine

## 2023-04-18 ENCOUNTER — Ambulatory Visit (INDEPENDENT_AMBULATORY_CARE_PROVIDER_SITE_OTHER): Payer: 59 | Admitting: Family Medicine

## 2023-04-18 VITALS — BP 116/72 | HR 60 | Temp 98.0°F | Resp 18 | Ht 60.0 in | Wt 111.1 lb

## 2023-04-18 DIAGNOSIS — E063 Autoimmune thyroiditis: Secondary | ICD-10-CM

## 2023-04-18 DIAGNOSIS — F39 Unspecified mood [affective] disorder: Secondary | ICD-10-CM

## 2023-04-18 LAB — TSH: TSH: 6.81 u[IU]/mL — ABNORMAL HIGH (ref 0.35–5.50)

## 2023-04-18 MED ORDER — SYNTHROID 50 MCG PO TABS
50.0000 ug | ORAL_TABLET | Freq: Every morning | ORAL | 3 refills | Status: DC
Start: 2023-04-18 — End: 2023-10-28

## 2023-04-18 MED ORDER — LEVOTHYROXINE SODIUM 25 MCG PO TABS
25.0000 ug | ORAL_TABLET | ORAL | 0 refills | Status: DC
Start: 2023-04-18 — End: 2023-04-18

## 2023-04-18 MED ORDER — LEVOTHYROXINE SODIUM 25 MCG PO TABS
25.0000 ug | ORAL_TABLET | Freq: Every day | ORAL | 0 refills | Status: DC
Start: 2023-04-18 — End: 2023-04-19

## 2023-04-18 MED ORDER — CITALOPRAM HYDROBROMIDE 10 MG PO TABS
10.0000 mg | ORAL_TABLET | Freq: Every day | ORAL | 3 refills | Status: DC
Start: 2023-04-18 — End: 2023-10-28

## 2023-04-18 NOTE — Telephone Encounter (Signed)
 Left message to return call to our office.  Okay to read results.

## 2023-04-18 NOTE — Patient Instructions (Addendum)
It was a pleasure meeting you today. Thank you for allowing me to take part in your health care.  Our goals for today as we discussed include:  Check TSH today Continue Synthroid 50 mcg daily Take Synthroid 25 mcg with the 50 mcg on Sunday. Total 75 mcg   This is a list of the screening recommended for you and due dates:  Health Maintenance  Topic Date Due   COVID-19 Vaccine (3 - 2024-25 season) 11/28/2022   Flu Shot  06/27/2023*   Mammogram  12/06/2024   Colon Cancer Screening  04/20/2025   Pap with HPV screening  11/10/2026   Hepatitis C Screening  Completed   HIV Screening  Completed   Zoster (Shingles) Vaccine  Completed   HPV Vaccine  Aged Out   DTaP/Tdap/Td vaccine  Discontinued  *Topic was postponed. The date shown is not the original due date.     Follow up in 6 months for repeat TSH  If you have any questions or concerns, please do not hesitate to call the office at [phone].  I look forward to our next visit and until then take care and stay safe.  Regards,   Dana Allan, MD   [facility]

## 2023-04-18 NOTE — Progress Notes (Signed)
SUBJECTIVE:   Chief Complaint  Patient presents with   Medical Management of Chronic Issues    6 month follow up   HPI Follow up chronic disease management  Discussed the use of AI scribe software for clinical note transcription with the patient, who gave verbal consent to proceed.  History of Present Illness The patient, with a known history of hypothyroidism, presents for a six-month follow-up. They report no new symptoms or health concerns. The patient is currently on a regimen of 50mg  Synthroid daily, with an additional 25mg  dose once a week. They have been adhering to this regimen without any noted side effects or complications.  The patient also has a history of mood disturbances, for which they are taking Celexa. They report that the medication has been helpful, particularly in managing work-related stress. They have expressed a desire to discontinue the medication upon retirement, but understand the need for a gradual taper to avoid withdrawal symptoms.  In the past, the patient has been provided with sample medications, which has allowed them to maintain a surplus of their thyroid medication. They have expressed a preference for local care and have no immediate plans to see an endocrinologist unless their condition changes.  The patient's last thyroid function tests were conducted six months ago, and they are due for another round of testing. They have expressed a willingness to undergo these tests during the current visit. They have been asymptomatic, with no signs of hyperthyroid or hypothyroid states.  The patient's other routine screenings, including mammograms, are up-to-date. They have not reported any new health concerns or changes in their condition since their last visit.   PERTINENT PMH / PSH: As above  OBJECTIVE:  BP 116/72   Pulse 60   Temp 98 F (36.7 C)   Resp 18   Ht 5' (1.524 m)   Wt 111 lb 2 oz (50.4 kg)   SpO2 100%   BMI 21.70 kg/m    Physical  Exam Vitals reviewed.  Constitutional:      General: She is not in acute distress.    Appearance: Normal appearance. She is normal weight. She is not ill-appearing, toxic-appearing or diaphoretic.  Eyes:     General:        Right eye: No discharge.        Left eye: No discharge.     Conjunctiva/sclera: Conjunctivae normal.  Cardiovascular:     Rate and Rhythm: Normal rate.  Pulmonary:     Effort: Pulmonary effort is normal.  Neurological:     Mental Status: She is alert and oriented to person, place, and time. Mental status is at baseline.  Psychiatric:        Mood and Affect: Mood normal.        Behavior: Behavior normal.        Thought Content: Thought content normal.        Judgment: Judgment normal.           04/18/2023    8:20 AM 11/05/2022    9:13 AM  Depression screen PHQ 2/9  Decreased Interest 0 0  Down, Depressed, Hopeless 0 0  PHQ - 2 Score 0 0  Altered sleeping 1 1  Tired, decreased energy 1 1  Change in appetite 0 0  Feeling bad or failure about yourself  0 0  Trouble concentrating 0 0  Moving slowly or fidgety/restless 0 0  Suicidal thoughts 0 0  PHQ-9 Score 2 2  Difficult doing work/chores Not difficult  at all Not difficult at all      04/18/2023    8:20 AM 11/05/2022    9:13 AM  GAD 7 : Generalized Anxiety Score  Nervous, Anxious, on Edge 0 0  Control/stop worrying 0 0  Worry too much - different things 0 0  Trouble relaxing 0 0  Restless 0 0  Easily annoyed or irritable 0 0  Afraid - awful might happen 0 0  Total GAD 7 Score 0 0  Anxiety Difficulty Not difficult at all Not difficult at all    ASSESSMENT/PLAN:  Hypothyroidism due to Hashimoto thyroiditis Assessment & Plan: On Synthroid daily with an additional on Sundays. Last TSH was 4.6. Discussed the option of referral to endocrinology, but patient prefers to stay local and continue current management as long as asymptomatic. -Order TSH today to assess current thyroid  function. -Continue current Synthroid regimen. -Consider referral to endocrinology if TSH levels are not stable or if patient becomes symptomatic.  Orders: -     Synthroid; Take 1 tablet (50 mcg total) by mouth every morning.  Dispense: 90 tablet; Refill: 3 -     TSH -     Levothyroxine Sodium; Take 1 tablet (25 mcg total) by mouth once a week. Take 25 mcg on Sunday with 1 tablet of 50 mcg.  Dispense: 30 tablet; Refill: 0 -     TSH; Future  Mood disorder [facility]) Assessment & Plan: On Celexa, which is helping. Patient has expressed a desire to discontinue this medication upon retirement. -Continue Celexa. -Advise patient to not abruptly discontinue Celexa due to risk of withdrawal symptoms. Plan for a slow taper when the time comes.  Orders: -     Citalopram Hydrobromide; Take 1 tablet (10 mg total) by mouth daily.  Dispense: 90 tablet; Refill: 3   General Health Maintenance -All screenings, including mammogram, are up to date.   PDMP reviewed  Return for PCP, thyroid.  Dana Allan, MD

## 2023-04-18 NOTE — Assessment & Plan Note (Signed)
On Synthroid daily with an additional on Sundays. Last TSH was 4.6. Discussed the option of referral to endocrinology, but patient prefers to stay local and continue current management as long as asymptomatic. -Order TSH today to assess current thyroid function. -Continue current Synthroid regimen. -Consider referral to endocrinology if TSH levels are not stable or if patient becomes symptomatic.

## 2023-04-18 NOTE — Assessment & Plan Note (Signed)
On Celexa, which is helping. Patient has expressed a desire to discontinue this medication upon retirement. -Continue Celexa. -Advise patient to not abruptly discontinue Celexa due to risk of withdrawal symptoms. Plan for a slow taper when the time comes.

## 2023-04-18 NOTE — Telephone Encounter (Signed)
-----   Message from Dana Allan sent at 04/18/2023  3:05 PM EST ----- TSH increased.   Recommend she increase her Synthroid to 75 mcg daily. I have updated the prescription for 25 mcg.  Take 50 mcg tablet and 25 mcg tablet every morning together.  Will recheck her TSH in 6 weeks

## 2023-04-19 ENCOUNTER — Other Ambulatory Visit: Payer: Self-pay | Admitting: Family Medicine

## 2023-05-09 ENCOUNTER — Ambulatory Visit: Payer: BC Managed Care – PPO | Admitting: Family Medicine

## 2023-05-31 ENCOUNTER — Other Ambulatory Visit (INDEPENDENT_AMBULATORY_CARE_PROVIDER_SITE_OTHER): Payer: 59

## 2023-05-31 DIAGNOSIS — E063 Autoimmune thyroiditis: Secondary | ICD-10-CM

## 2023-05-31 LAB — TSH: TSH: 0.21 u[IU]/mL — ABNORMAL LOW (ref 0.35–5.50)

## 2023-06-08 ENCOUNTER — Other Ambulatory Visit: Payer: Self-pay | Admitting: Family Medicine

## 2023-06-08 DIAGNOSIS — E063 Autoimmune thyroiditis: Secondary | ICD-10-CM

## 2023-06-09 ENCOUNTER — Telehealth: Payer: Self-pay

## 2023-06-09 NOTE — Telephone Encounter (Signed)
Spoke to pt about lab results.

## 2023-06-09 NOTE — Telephone Encounter (Signed)
 Copied from CRM [id number]. Topic: General - Other >> Jun 09, 2023  3:15 PM [place] A wrote: Reason for CRM: Patient was returning "[facility]" call- called [facility] but Harvin Hazel was not avaialble

## 2023-06-14 ENCOUNTER — Other Ambulatory Visit: Payer: Self-pay | Admitting: Family Medicine

## 2023-06-14 DIAGNOSIS — E063 Autoimmune thyroiditis: Secondary | ICD-10-CM

## 2023-06-23 ENCOUNTER — Telehealth: Payer: Self-pay | Admitting: Family Medicine

## 2023-06-23 NOTE — Telephone Encounter (Signed)
 Dr Clent Ridges is leaving the practice and your appointment on 10/17/23 needs to be rescheduled with another provider. Also you will need to schedule a transfer of care appointment with another provider to continue care at this office. Please call the office to schedule a Transfer of Care to either Dr Charlann Lange, MD, Bethanie Dicker, NP or Kara Dies, NP.   Thank you  E2C2, when patient calls back, please let them know their follow-up appointment has been rescheduled as a TOC visit in August. [employer]

## 2023-07-13 ENCOUNTER — Other Ambulatory Visit (INDEPENDENT_AMBULATORY_CARE_PROVIDER_SITE_OTHER)

## 2023-07-13 DIAGNOSIS — E063 Autoimmune thyroiditis: Secondary | ICD-10-CM

## 2023-07-13 LAB — TSH: TSH: 5.49 u[IU]/mL (ref 0.35–5.50)

## 2023-10-17 ENCOUNTER — Ambulatory Visit: Payer: 59 | Admitting: Family Medicine

## 2023-11-02 ENCOUNTER — Ambulatory Visit

## 2023-11-02 ENCOUNTER — Ambulatory Visit: Payer: Self-pay

## 2023-11-02 VITALS — BP 102/78 | HR 58 | Temp 97.8°F | Ht 63.0 in | Wt 104.8 lb

## 2023-11-02 DIAGNOSIS — E782 Mixed hyperlipidemia: Secondary | ICD-10-CM

## 2023-11-02 DIAGNOSIS — E538 Deficiency of other specified B group vitamins: Secondary | ICD-10-CM

## 2023-11-02 DIAGNOSIS — F39 Unspecified mood [affective] disorder: Secondary | ICD-10-CM

## 2023-11-02 DIAGNOSIS — R7309 Other abnormal glucose: Secondary | ICD-10-CM | POA: Diagnosis not present

## 2023-11-02 DIAGNOSIS — E063 Autoimmune thyroiditis: Secondary | ICD-10-CM

## 2023-11-02 DIAGNOSIS — Z13220 Encounter for screening for lipoid disorders: Secondary | ICD-10-CM

## 2023-11-02 LAB — VITAMIN B12: Vitamin B-12: 321 pg/mL (ref 211–911)

## 2023-11-02 LAB — LIPID PANEL
Cholesterol: 182 mg/dL (ref 0–200)
HDL: 74.1 mg/dL (ref >39.00)
LDL Cholesterol: 91 mg/dL (ref 0–99)
NonHDL: 108.19
Total CHOL/HDL Ratio: 2
Triglycerides: 85 mg/dL (ref 0.0–149.0)
VLDL: 17 mg/dL (ref 0.0–40.0)

## 2023-11-02 LAB — HEMOGLOBIN A1C: Hgb A1c MFr Bld: 5.9 % (ref 4.6–6.5)

## 2023-11-02 LAB — BASIC METABOLIC PANEL WITH GFR
BUN: 15 mg/dL (ref 6–23)
CO2: 27 meq/L (ref 19–32)
Calcium: 9.7 mg/dL (ref 8.4–10.5)
Chloride: 101 meq/L (ref 96–112)
Creatinine, Ser: 0.87 mg/dL (ref 0.40–1.20)
GFR: 70.86 mL/min (ref >60.00)
Glucose, Bld: 81 mg/dL (ref 70–99)
Potassium: 4.4 meq/L (ref 3.5–5.1)
Sodium: 139 meq/L (ref 135–145)

## 2023-11-02 LAB — T4, FREE: Free T4: 0.8 ng/dL (ref 0.60–1.60)

## 2023-11-02 LAB — TSH: TSH: 1.85 u[IU]/mL (ref 0.35–5.50)

## 2023-11-02 MED ORDER — CITALOPRAM HYDROBROMIDE 10 MG PO TABS: 10.0000 mg | ORAL_TABLET | Freq: Every day | ORAL | 3 refills | Status: AC

## 2023-11-02 MED ORDER — SYNTHROID 50 MCG PO TABS: 50.0000 ug | ORAL_TABLET | Freq: Every morning | ORAL | 3 refills | Status: AC

## 2023-11-02 NOTE — Assessment & Plan Note (Signed)
 B12 on low normal level.  Repeat B 12 today. Counseled on increasing intake of diet rich in b12 and adding 1000 mcg b12 daily which she can get over the counter.

## 2023-11-02 NOTE — Assessment & Plan Note (Addendum)
 On Celexa  10 mg daily which is helping with anxiety. Continue, refill sent. Will check BMP today.

## 2023-11-02 NOTE — Assessment & Plan Note (Signed)
 On Synthroid  50 mcg daily. Has needed dose adjustment due to fluctuating TSH levels. Check TSH, T4 today, refill on Synthroid  50 mcg sent to the pharmacy. She is taking medications as recommended.

## 2023-11-02 NOTE — Assessment & Plan Note (Signed)
 10 years ASCVD risk is 2.5%.  Repeat fasting lipid today. No indication for statin therapy at this time.  Encourage healthy lifestyle and increase activity.

## 2023-11-02 NOTE — Progress Notes (Signed)
 Established Patient Office Visit TOC from Dr. Hope    Subjective  Patient ID: Latasha Taylor, female    DOB: January 14, 1961  Age: 63 y.o. MRN: [id number]  Chief Complaint  Patient presents with   Establish Care    She  has a past medical history of Hypothyroidism and Migraine.  HPI Discussed the use of AI scribe software for clinical note transcription with the patient, who gave verbal consent to proceed.  History of Present Illness Latasha Taylor is a 63 year old female who presents for TOC from Dr. Hope and f/u on chronic conditions.   She has a history of hypothyroidism and takes Synthroid  50 mcg daily on an empty stomach in the morning. Her thyroid  medication dosage has been adjusted in the past based on her lab results. She experiences no symptoms of dry skin, constipation, or heat/cold intolerance beyond her baseline cold nature.  She has a history of migraine headaches, primarily experienced during her younger years and associated with her menstrual cycle. She describes seeing 'little dots' as a visual aura preceding the headaches. She has not experienced migraines in the past couple of years.  She takes Celexa  for anxiety, which she started three years ago following the sudden death of her husband from a heart attack. She receives significant emotional support from her two daughters and grandchildren. No SI/HI.   She works at [employer] in [employer] and enjoys walking daily for exercise. She is currently on summer break and will return to work in two weeks.   She has normal/low B 12 level. She exercises regularly.  She does not take B12 supplements. She takes a calcium and bone gummy daily for dental health.  ROS As per HPI    Objective:     BP 102/78 (BP Location: Right Arm, Patient Position: Sitting, Cuff Size: Small)   Pulse (!) 58   Temp 97.8 F (36.6 C) (Oral)   Ht 5' 3 (1.6 m)   Wt 104 lb 12.8 oz (47.5 kg)   SpO2 98%   BMI 18.56 kg/m       11/02/2023    9:48 AM 04/18/2023    8:20 AM 11/05/2022    9:13 AM  Depression screen PHQ 2/9  Decreased Interest 1 0 0  Down, Depressed, Hopeless 1 0 0  PHQ - 2 Score 2 0 0  Altered sleeping 1 1 1   Tired, decreased energy 1 1 1   Change in appetite 1 0 0  Feeling bad or failure about yourself  0 0 0  Trouble concentrating 0 0 0  Moving slowly or fidgety/restless 0 0 0  Suicidal thoughts 0 0 0  PHQ-9 Score 5 2 2   Difficult doing work/chores Not difficult at all Not difficult at all Not difficult at all      11/02/2023    9:48 AM 04/18/2023    8:20 AM 11/05/2022    9:13 AM  GAD 7 : Generalized Anxiety Score  Nervous, Anxious, on Edge 0 0 0  Control/stop worrying 1 0 0  Worry too much - different things 1 0 0  Trouble relaxing 0 0 0  Restless 0 0 0  Easily annoyed or irritable 0 0 0  Afraid - awful might happen 0 0 0  Total GAD 7 Score 2 0 0  Anxiety Difficulty Not difficult at all Not difficult at all Not difficult at all      11/02/2023    9:48 AM 04/18/2023  8:20 AM 11/05/2022    9:13 AM  Depression screen PHQ 2/9  Decreased Interest 1 0 0  Down, Depressed, Hopeless 1 0 0  PHQ - 2 Score 2 0 0  Altered sleeping 1 1 1   Tired, decreased energy 1 1 1   Change in appetite 1 0 0  Feeling bad or failure about yourself  0 0 0  Trouble concentrating 0 0 0  Moving slowly or fidgety/restless 0 0 0  Suicidal thoughts 0 0 0  PHQ-9 Score 5 2 2   Difficult doing work/chores Not difficult at all Not difficult at all Not difficult at all      11/02/2023    9:48 AM 04/18/2023    8:20 AM 11/05/2022    9:13 AM  GAD 7 : Generalized Anxiety Score  Nervous, Anxious, on Edge 0 0 0  Control/stop worrying 1 0 0  Worry too much - different things 1 0 0  Trouble relaxing 0 0 0  Restless 0 0 0  Easily annoyed or irritable 0 0 0  Afraid - awful might happen 0 0 0  Total GAD 7 Score 2 0 0  Anxiety Difficulty Not difficult at all Not difficult at all Not difficult at all   SDOH Screenings    Depression (PHQ2-9): Medium Risk (11/02/2023)  Physical Activity: Insufficiently Active (09/30/2017)  Tobacco Use: Low Risk  (11/02/2023)     Physical Exam Constitutional:      General: She is not in acute distress.    Appearance: Normal appearance. She is normal weight.  HENT:     Head: Normocephalic and atraumatic.     Mouth/Throat:     Mouth: Mucous membranes are moist.  Neck:     Thyroid : No thyroid  mass or thyroid  tenderness.  Cardiovascular:     Rate and Rhythm: Normal rate and regular rhythm.  Pulmonary:     Effort: Pulmonary effort is normal.     Breath sounds: Normal breath sounds.  Abdominal:     General: Bowel sounds are normal.     Palpations: Abdomen is soft.     Tenderness: There is no guarding.  Musculoskeletal:     Cervical back: Neck supple. No rigidity or tenderness.     Right lower leg: No edema.     Left lower leg: No edema.  Skin:    General: Skin is warm.  Neurological:     Mental Status: She is alert and oriented to person, place, and time.  Psychiatric:        Mood and Affect: Mood normal.        Behavior: Behavior normal.        No results found for any visits on 11/02/23.  The 10-year ASCVD risk score (Arnett DK, et al., 2019) is: 2.5%     Assessment & Plan:  Patient is a pleasant 63 year old female presenting for TOC. Counseling on updating pneumonia immunization done during today's visit. She will check with her local pharmacy and or let us  know if she would like to update immunization during next office visit here.   Mood disorder (HCC) Assessment & Plan: On Celexa  10 mg daily which is helping with anxiety. Continue, refill sent. Will check BMP today.   Orders: -     Citalopram  Hydrobromide; Take 1 tablet (10 mg total) by mouth daily.  Dispense: 90 tablet; Refill: 3 -     Basic metabolic panel with GFR  Hypothyroidism due to Hashimoto thyroiditis Assessment & Plan: On Synthroid  50  mcg daily. Has needed dose adjustment due to  fluctuating TSH levels. Check TSH, T4 today, refill on Synthroid  50 mcg sent to the pharmacy. She is taking medications as recommended.   Orders: -     Synthroid ; Take 1 tablet (50 mcg total) by mouth every morning.  Dispense: 90 tablet; Refill: 3 -     TSH -     T4, free  Abnormal glucose Assessment & Plan: Prediabetes based on A1c from 11/09/22. Repeat A1c today. Diet, regular exercise recommended to reduce risk of progression to diabetes.   Orders: -     Hemoglobin A1c  Low serum vitamin B12 Assessment & Plan: B12 on low normal level.  Repeat B 12 today. Counseled on increasing intake of diet rich in b12 and adding 1000 mcg b12 daily which she can get over the counter.   Orders: -     Vitamin B12  Mixed hyperlipidemia Assessment & Plan: 10 years ASCVD risk is 2.5%.  Repeat fasting lipid today. No indication for statin therapy at this time.  Encourage healthy lifestyle and increase activity.  Orders: -     Lipid panel    Return in about 1 year (around 11/01/2024) for Annual physical plus chronic follow up . Sooner if needed.   Luke Shade, MD

## 2023-11-02 NOTE — Progress Notes (Signed)
 Please let the patient know her TSH, T4 are both within normal range, continue current thyroid  medication. A1c, kidney function looks stable. Cholesterol looks better compared to previous labs.  B 12 is low normal and stable when compared to 11 months ago, take over the counter B 12 supplement 1000 mcg daily as discussed during office visit.   Thank you,  Luke Shade, MD

## 2023-11-02 NOTE — Assessment & Plan Note (Signed)
 Prediabetes based on A1c from 11/09/22. Repeat A1c today. Diet, regular exercise recommended to reduce risk of progression to diabetes.

## 2023-11-02 NOTE — Patient Instructions (Addendum)
 Please check with your local pharmacy to update on pneumonia vaccine.   I am sending refill on your medications. I am also checking your cholesterol, kidney, liver function, B 12, thyroid  hormone level and A1c.   Please start taking over the counter vitamin B 12, 1000 mcg daily.

## 2023-11-13 NOTE — Progress Notes (Unsigned)
 PCP: Abbey Bruckner, MD   No chief complaint on file.   HPI:      [name] is a 63 y.o. G2P2002 whose LMP was No LMP recorded. Patient is postmenopausal., presents today for her annual examination.  Her menses are {norm/abn:715}, lasting {number: 22536} days.  Dysmenorrhea {dysmen:716}. She {does:18564} have intermenstrual bleeding. She {does:18564} have vasomotor sx.   Sex activity: {sex active: 315163}. She {does:18564} have vaginal dryness.  Last Pap: 11/09/21 Results were: no abnormalities /neg HPV DNA.  Hx of STDs: {STD hx:14358}  Last mammogram: 12/07/22 Results were: normal--routine follow-up in 12 months There is no FH of breast cancer. There is no FH of ovarian cancer. The patient {does:18564} do self-breast exams.  Colonoscopy: never; Cologuard neg 2023,  Repeat due after 3 years.   Tobacco use: {tob:20664} Alcohol use: {Alcohol:11675} No drug use Exercise: {exercise:31265}  She {does:18564} get adequate calcium and Vitamin D  in her diet.  Labs with PCP.   Patient Active Problem List   Diagnosis Date Noted   Hypothyroid 11/21/2022   Low serum vitamin B12 11/21/2022   Abnormal glucose 11/21/2022   Mood disorder (HCC) 11/21/2022   Osteopenia 11/21/2022   Hyperlipidemia 11/21/2022    Past Surgical History:  Procedure Laterality Date   BREAST BIOPSY Left 1992   benign   COLONOSCOPY N/A 04/21/2015   Procedure: COLONOSCOPY;  Surgeon: Deward CINDERELLA Piedmont, MD;  Location: [facility];  Service: Gastroenterology;  Laterality: N/A;   endocervical polyp removal  2005   ENDOMETRIAL BIOPSY  1998    Family History  Problem Relation Age of Onset   Celiac disease Mother    Heart disease Father    Colon cancer Maternal Grandfather 46   Heart disease Paternal Grandmother    Breast cancer Neg Hx     Social History   Socioeconomic History   Marital status: Widowed    Spouse name: Not on file   Number of children: Not on file   Years of education: Not on file    Highest education level: Not on file  Occupational History   Not on file  Tobacco Use   Smoking status: Never   Smokeless tobacco: Never  Vaping Use   Vaping status: Never Used  Substance and Sexual Activity   Alcohol use: No   Drug use: No   Sexual activity: Not Currently    Birth control/protection: Post-menopausal  Other Topics Concern   Not on file  Social History Narrative   Not on file   Social Drivers of Health   Financial Resource Strain: Not on file  Food Insecurity: Not on file  Transportation Needs: Not on file  Physical Activity: Insufficiently Active (09/30/2017)   Exercise Vital Sign    Days of Exercise per Week: 3 days    Minutes of Exercise per Session: 30 min  Stress: Not on file  Social Connections: Not on file  Intimate Partner Violence: Not on file     Current Outpatient Medications:    citalopram  (CELEXA ) 10 MG tablet, Take 1 tablet (10 mg total) by mouth daily., Disp: 90 tablet, Rfl: 3   SYNTHROID  50 MCG tablet, Take 1 tablet (50 mcg total) by mouth every morning., Disp: 90 tablet, Rfl: 3     ROS:  Review of Systems BREAST: No symptoms    Objective: There were no vitals taken for this visit.   OBGyn Exam  Results: No results found for this or any previous visit (from the past 24 hours).  Assessment/Plan:  No diagnosis found.   No orders of the defined types were placed in this encounter.           GYN counsel {counseling: 16159}    F/U  No follow-ups on file.  Saraiah Bhat B. Tearah Saulsbury, PA-C 11/13/2023 5:52 PM

## 2023-11-14 ENCOUNTER — Ambulatory Visit: Admitting: Obstetrics and Gynecology

## 2023-11-14 ENCOUNTER — Encounter: Payer: Self-pay | Admitting: Obstetrics and Gynecology

## 2023-11-14 VITALS — BP 108/69 | HR 75 | Ht 61.0 in | Wt 105.0 lb

## 2023-11-14 DIAGNOSIS — Z1231 Encounter for screening mammogram for malignant neoplasm of breast: Secondary | ICD-10-CM

## 2023-11-14 DIAGNOSIS — Z01419 Encounter for gynecological examination (general) (routine) without abnormal findings: Secondary | ICD-10-CM | POA: Diagnosis not present

## 2023-11-14 DIAGNOSIS — Z1211 Encounter for screening for malignant neoplasm of colon: Secondary | ICD-10-CM

## 2023-11-14 NOTE — Patient Instructions (Addendum)
 I value your feedback and you entrusting [place] with your care. If you get a [facility] patient survey, I would appreciate you taking the time to let us know about your experience today. Thank you!  [facility] ([place])--[phone]

## 2023-12-08 ENCOUNTER — Ambulatory Visit
Admission: RE | Admit: 2023-12-08 | Discharge: 2023-12-08 | Disposition: A | Discharge status: Home / Self Care | Source: Ambulatory Visit | Attending: Obstetrics and Gynecology | Admitting: Obstetrics and Gynecology

## 2023-12-08 DIAGNOSIS — Z1231 Encounter for screening mammogram for malignant neoplasm of breast: Secondary | ICD-10-CM | POA: Diagnosis present

## 2023-12-13 ENCOUNTER — Ambulatory Visit: Payer: Self-pay | Admitting: Obstetrics and Gynecology

## 2024-11-05 ENCOUNTER — Encounter

## 2024-11-13 ENCOUNTER — Encounter
